# Patient Record
Sex: Male | Born: 1955
Health system: Southern US, Community
[De-identification: ages and names within clinical notes are randomized; demographics above are authoritative.]

## PROBLEM LIST (undated history)

## (undated) DIAGNOSIS — H269 Unspecified cataract: Secondary | ICD-10-CM

## (undated) DIAGNOSIS — H409 Unspecified glaucoma: Secondary | ICD-10-CM

## (undated) DIAGNOSIS — R011 Cardiac murmur, unspecified: Secondary | ICD-10-CM

## (undated) DIAGNOSIS — J45909 Unspecified asthma, uncomplicated: Secondary | ICD-10-CM

## (undated) DIAGNOSIS — I10 Essential (primary) hypertension: Secondary | ICD-10-CM

## (undated) DIAGNOSIS — T7840XA Allergy, unspecified, initial encounter: Secondary | ICD-10-CM

## (undated) DIAGNOSIS — M1990 Unspecified osteoarthritis, unspecified site: Secondary | ICD-10-CM

## (undated) DIAGNOSIS — E785 Hyperlipidemia, unspecified: Secondary | ICD-10-CM

## (undated) DIAGNOSIS — H3320 Serous retinal detachment, unspecified eye: Secondary | ICD-10-CM

## (undated) HISTORY — DX: Unspecified asthma, uncomplicated: J45.909

## (undated) HISTORY — DX: Unspecified glaucoma: H40.9

## (undated) HISTORY — PX: FINGER SURGERY: SHX640

## (undated) HISTORY — DX: Cardiac murmur, unspecified: R01.1

## (undated) HISTORY — DX: Unspecified cataract: H26.9

## (undated) HISTORY — PX: COLONOSCOPY: SHX174

## (undated) HISTORY — DX: Allergy, unspecified, initial encounter: T78.40XA

## (undated) HISTORY — DX: Essential (primary) hypertension: I10

## (undated) HISTORY — DX: Unspecified osteoarthritis, unspecified site: M19.90

## (undated) HISTORY — PX: FOOT SURGERY: SHX648

## (undated) HISTORY — PX: EYE SURGERY: SHX253

## (undated) HISTORY — DX: Serous retinal detachment, unspecified eye: H33.20

## (undated) HISTORY — DX: Hyperlipidemia, unspecified: E78.5

---

## 2005-02-06 ENCOUNTER — Ambulatory Visit: Payer: Self-pay | Admitting: Endocrinology

## 2005-02-17 ENCOUNTER — Ambulatory Visit: Payer: Self-pay | Admitting: Internal Medicine

## 2005-11-13 ENCOUNTER — Ambulatory Visit: Payer: Self-pay | Admitting: Internal Medicine

## 2005-11-16 ENCOUNTER — Emergency Department (HOSPITAL_COMMUNITY): Admission: EM | Admit: 2005-11-16 | Discharge: 2005-11-16 | Payer: Self-pay | Admitting: Emergency Medicine

## 2006-02-02 ENCOUNTER — Ambulatory Visit: Payer: Self-pay | Admitting: Internal Medicine

## 2006-04-06 ENCOUNTER — Encounter (INDEPENDENT_AMBULATORY_CARE_PROVIDER_SITE_OTHER): Payer: Self-pay | Admitting: *Deleted

## 2006-04-06 ENCOUNTER — Ambulatory Visit (HOSPITAL_BASED_OUTPATIENT_CLINIC_OR_DEPARTMENT_OTHER): Admission: RE | Admit: 2006-04-06 | Discharge: 2006-04-06 | Payer: Self-pay | Admitting: Orthopedic Surgery

## 2006-04-13 ENCOUNTER — Ambulatory Visit: Payer: Self-pay | Admitting: Internal Medicine

## 2006-04-27 ENCOUNTER — Emergency Department (HOSPITAL_COMMUNITY): Admission: EM | Admit: 2006-04-27 | Discharge: 2006-04-28 | Payer: Self-pay | Admitting: Emergency Medicine

## 2006-04-29 ENCOUNTER — Ambulatory Visit: Payer: Self-pay | Admitting: Cardiology

## 2006-04-30 ENCOUNTER — Ambulatory Visit: Payer: Self-pay | Admitting: Internal Medicine

## 2006-12-28 ENCOUNTER — Ambulatory Visit: Payer: Self-pay | Admitting: Internal Medicine

## 2006-12-28 LAB — CONVERTED CEMR LAB
ALT: 20 units/L (ref 0–40)
AST: 25 units/L (ref 0–37)
Albumin: 3.8 g/dL (ref 3.5–5.2)
Alkaline Phosphatase: 56 units/L (ref 39–117)
BUN: 17 mg/dL (ref 6–23)
Basophils Absolute: 0 10*3/uL (ref 0.0–0.1)
Basophils Relative: 0.6 % (ref 0.0–1.0)
Bilirubin Urine: NEGATIVE
Bilirubin, Direct: 0.1 mg/dL (ref 0.0–0.3)
CO2: 31 meq/L (ref 19–32)
Calcium: 9.5 mg/dL (ref 8.4–10.5)
Chloride: 102 meq/L (ref 96–112)
Cholesterol: 196 mg/dL (ref 0–200)
Creatinine, Ser: 1.2 mg/dL (ref 0.4–1.5)
Crystals: NEGATIVE
Eosinophils Absolute: 0.3 10*3/uL (ref 0.0–0.6)
Eosinophils Relative: 6.7 % — ABNORMAL HIGH (ref 0.0–5.0)
GFR calc Af Amer: 82 mL/min
GFR calc non Af Amer: 68 mL/min
Glucose, Bld: 135 mg/dL — ABNORMAL HIGH (ref 70–99)
HCT: 44.9 % (ref 39.0–52.0)
HDL: 41.2 mg/dL (ref >39.0)
Hemoglobin, Urine: NEGATIVE
Hemoglobin: 15.3 g/dL (ref 13.0–17.0)
Ketones, ur: NEGATIVE mg/dL
LDL Cholesterol: 140 mg/dL — ABNORMAL HIGH (ref 0–99)
Leukocytes, UA: NEGATIVE
Lymphocytes Relative: 46.2 % — ABNORMAL HIGH (ref 12.0–46.0)
MCHC: 34 g/dL (ref 30.0–36.0)
MCV: 85.2 fL (ref 78.0–100.0)
Monocytes Absolute: 0.4 10*3/uL (ref 0.2–0.7)
Monocytes Relative: 9.7 % (ref 3.0–11.0)
Mucus, UA: NEGATIVE
Neutro Abs: 1.5 10*3/uL (ref 1.4–7.7)
Neutrophils Relative %: 36.8 % — ABNORMAL LOW (ref 43.0–77.0)
Nitrite: NEGATIVE
PSA: 2.39 ng/mL (ref 0.10–4.00)
Platelets: 231 10*3/uL (ref 150–400)
Potassium: 3.3 meq/L — ABNORMAL LOW (ref 3.5–5.1)
RBC / HPF: NONE SEEN
RBC: 5.27 M/uL (ref 4.22–5.81)
RDW: 12.9 % (ref 11.5–14.6)
Sodium: 139 meq/L (ref 135–145)
Specific Gravity, Urine: 1.02 (ref 1.000–1.03)
TSH: 0.52 microintl units/mL (ref 0.35–5.50)
Total Bilirubin: 1 mg/dL (ref 0.3–1.2)
Total CHOL/HDL Ratio: 4.8
Total Protein, Urine: >=300 mg/dL — AB
Total Protein: 7.3 g/dL (ref 6.0–8.3)
Triglycerides: 72 mg/dL (ref 0–149)
Urine Glucose: NEGATIVE mg/dL
Urobilinogen, UA: 1 (ref 0.0–1.0)
VLDL: 14 mg/dL (ref 0–40)
WBC: 4.2 10*3/uL — ABNORMAL LOW (ref 4.5–10.5)
pH: 7 (ref 5.0–8.0)

## 2007-01-05 ENCOUNTER — Ambulatory Visit: Payer: Self-pay | Admitting: Internal Medicine

## 2007-05-27 ENCOUNTER — Encounter: Payer: Self-pay | Admitting: Internal Medicine

## 2007-09-14 ENCOUNTER — Encounter: Payer: Self-pay | Admitting: Internal Medicine

## 2008-02-29 ENCOUNTER — Ambulatory Visit: Payer: Self-pay | Admitting: Internal Medicine

## 2008-02-29 DIAGNOSIS — I10 Essential (primary) hypertension: Secondary | ICD-10-CM | POA: Insufficient documentation

## 2008-02-29 DIAGNOSIS — J209 Acute bronchitis, unspecified: Secondary | ICD-10-CM | POA: Insufficient documentation

## 2008-02-29 DIAGNOSIS — E119 Type 2 diabetes mellitus without complications: Secondary | ICD-10-CM

## 2008-02-29 DIAGNOSIS — E785 Hyperlipidemia, unspecified: Secondary | ICD-10-CM | POA: Insufficient documentation

## 2008-03-05 ENCOUNTER — Telehealth: Payer: Self-pay | Admitting: Internal Medicine

## 2008-03-07 ENCOUNTER — Telehealth: Payer: Self-pay | Admitting: Internal Medicine

## 2008-08-09 ENCOUNTER — Telehealth (INDEPENDENT_AMBULATORY_CARE_PROVIDER_SITE_OTHER): Payer: Self-pay | Admitting: *Deleted

## 2008-10-22 ENCOUNTER — Ambulatory Visit: Payer: Self-pay | Admitting: Internal Medicine

## 2008-10-22 DIAGNOSIS — G4700 Insomnia, unspecified: Secondary | ICD-10-CM | POA: Insufficient documentation

## 2008-10-22 DIAGNOSIS — N401 Enlarged prostate with lower urinary tract symptoms: Secondary | ICD-10-CM | POA: Insufficient documentation

## 2008-10-22 DIAGNOSIS — R5381 Other malaise: Secondary | ICD-10-CM | POA: Insufficient documentation

## 2008-10-22 DIAGNOSIS — R5383 Other fatigue: Secondary | ICD-10-CM

## 2008-10-22 DIAGNOSIS — N400 Enlarged prostate without lower urinary tract symptoms: Secondary | ICD-10-CM | POA: Insufficient documentation

## 2008-10-22 LAB — CONVERTED CEMR LAB
Alkaline Phosphatase: 67 units/L (ref 39–117)
Basophils Absolute: 0.1 10*3/uL (ref 0.0–0.1)
Bilirubin Urine: NEGATIVE
Bilirubin, Direct: 0.1 mg/dL (ref 0.0–0.3)
Blood Glucose, Fingerstick: 206
Calcium: 9.2 mg/dL (ref 8.4–10.5)
Chloride: 101 meq/L (ref 96–112)
Creatinine, Ser: 1.1 mg/dL (ref 0.4–1.5)
Creatinine,U: 187.7 mg/dL
Eosinophils Absolute: 0.3 10*3/uL (ref 0.0–0.7)
HDL: 47.4 mg/dL (ref >39.00)
Ketones, ur: NEGATIVE mg/dL
LDL Cholesterol: 125 mg/dL — ABNORMAL HIGH (ref 0–99)
Leukocytes, UA: NEGATIVE
Lymphocytes Relative: 38.2 % (ref 12.0–46.0)
MCHC: 34 g/dL (ref 30.0–36.0)
Microalb Creat Ratio: 1.1 mg/g (ref 0.0–30.0)
Microalb, Ur: 0.2 mg/dL (ref 0.0–1.9)
Neutro Abs: 2 10*3/uL (ref 1.4–7.7)
Neutrophils Relative %: 44.9 % (ref 43.0–77.0)
Platelets: 234 10*3/uL (ref 150.0–400.0)
RDW: 12.7 % (ref 11.5–14.6)
Sodium: 139 meq/L (ref 135–145)
Total Bilirubin: 1 mg/dL (ref 0.3–1.2)
Total CHOL/HDL Ratio: 4
Triglycerides: 63 mg/dL (ref 0.0–149.0)
pH: 7.5 (ref 5.0–8.0)

## 2009-05-24 ENCOUNTER — Telehealth: Payer: Self-pay | Admitting: Internal Medicine

## 2009-10-04 ENCOUNTER — Telehealth: Payer: Self-pay | Admitting: Internal Medicine

## 2009-12-05 ENCOUNTER — Ambulatory Visit: Payer: Self-pay | Admitting: Internal Medicine

## 2009-12-05 DIAGNOSIS — R209 Unspecified disturbances of skin sensation: Secondary | ICD-10-CM | POA: Insufficient documentation

## 2009-12-06 ENCOUNTER — Telehealth: Payer: Self-pay | Admitting: Internal Medicine

## 2009-12-17 LAB — CONVERTED CEMR LAB
Alkaline Phosphatase: 65 units/L (ref 39–117)
Basophils Absolute: 0.1 10*3/uL (ref 0.0–0.1)
Bilirubin, Direct: 0.2 mg/dL (ref 0.0–0.3)
CO2: 32 meq/L (ref 19–32)
Calcium: 9.6 mg/dL (ref 8.4–10.5)
Eosinophils Absolute: 0.5 10*3/uL (ref 0.0–0.7)
GFR calc non Af Amer: 73.63 mL/min (ref >60)
HCT: 45.4 % (ref 39.0–52.0)
Hemoglobin: 15.8 g/dL (ref 13.0–17.0)
Leukocytes, UA: NEGATIVE
Lymphs Abs: 2 10*3/uL (ref 0.7–4.0)
MCHC: 34.7 g/dL (ref 30.0–36.0)
MCV: 84.8 fL (ref 78.0–100.0)
Monocytes Absolute: 0.5 10*3/uL (ref 0.1–1.0)
Neutro Abs: 3.2 10*3/uL (ref 1.4–7.7)
Nitrite: NEGATIVE
RDW: 13.9 % (ref 11.5–14.6)
Sodium: 135 meq/L (ref 135–145)
Total Protein, Urine: 100 mg/dL
Total Protein: 7 g/dL (ref 6.0–8.3)
Vitamin B-12: 758 pg/mL (ref 211–911)
pH: 7 (ref 5.0–8.0)

## 2010-06-11 ENCOUNTER — Telehealth: Payer: Self-pay | Admitting: Internal Medicine

## 2010-08-19 NOTE — Progress Notes (Signed)
Summary: RF zolpidem  Phone Note Refill Request   Refills Requested: Medication #1:  ZOLPIDEM TARTRATE 10 MG TABS 1/2-1 tab at bedtime as needed insomnia   Dosage confirmed as above?Dosage Confirmed   Supply Requested: 30   Last Refilled: 01/06/2010 ******[facility]   Method Requested: Telephone to Pharmacy Next Appointment Scheduled: none Initial call taken by: Lanier Prude, [facility]),  June 11, 2010 8:20 AM  Follow-up for Phone Call        ok 3 ref Follow-up by: Tresa Garter MD,  June 11, 2010 1:01 PM    Prescriptions: ZOLPIDEM TARTRATE 10 MG TABS (ZOLPIDEM TARTRATE) 1/2-1 tab at bedtime as needed insomnia  #30 x 3   Entered by:   Lamar Sprinkles, CMA   Authorized by:   Tresa Garter MD   Signed by:   Lamar Sprinkles, CMA on 06/11/2010   Method used:   Telephoned to ...       [facility]* (retail)       [address]       [place], [place]  [address]       Ph: [phone]       Fax: [phone]   RxID:   [id number]

## 2010-08-19 NOTE — Progress Notes (Signed)
Summary: [facility]  Phone Note Other Incoming Call back at [facility]   Caller: [facility] Summary of Call: REFILL ON DOXAZOSIN  MESYLATE 4MG ---OK X 2 REFILLS/VG Initial call taken by: Tora Perches,  October 04, 2009 4:56 PM    Prescriptions: DOXAZOSIN MESYLATE 4 MG TABS (DOXAZOSIN MESYLATE) 1or 1/2  by mouth at bedtime  #30 x 2   Entered by:   Tora Perches   Authorized by:   Tresa Garter MD   Signed by:   Tora Perches on 10/04/2009   Method used:   Electronically to        [facility]* (retail)       [address]       [place], [place]  [address]       Ph: [phone]       Fax: [phone]   RxID:   [id number]

## 2010-08-19 NOTE — Progress Notes (Signed)
       New/Updated Medications: * GLUCOMETER TEST STRIPS use once daily as needed *Brand of pt's choice Prescriptions: GLUCOMETER TEST STRIPS use once daily as needed *Brand of pt's choice  #3 mth x 3    Entered by:   Lamar Sprinkles, CMA   Authorized by:   Jacques Navy MD   Signed by:   Lamar Sprinkles, CMA on 12/06/2009   Method used:   Faxed to ...       [facility]* (retail)       [address]       [place], [place]  [address]       Ph: [phone]       Fax: [phone]   RxID:   [id number]

## 2010-08-19 NOTE — Assessment & Plan Note (Signed)
Summary: ov per pt/?/cd   Vital Signs:  Patient profile:   55 year old male Height:      68 inches Weight:      192 pounds BMI:     29.30 O2 Sat:      98 % on Room air Temp:     98.1 degrees F oral Pulse rate:   79 / minute BP sitting:   150 / 100  (left arm) Cuff size:   large  Vitals Entered By: Lucious Groves (Dec 05, 2009 9:45 AM)  O2 Flow:  Room air CC: C/O sking tingling, sinus/allergy issues, HA, and notes increased BP lately./kb Is Patient Diabetic? Yes Pain Assessment Patient in pain? no      CBG Result 207 CBG Device ID Freestyle Lite   CC:  C/O sking tingling, sinus/allergy issues, HA, and and notes increased BP lately./kb.  History of Present Illness: The patient presents for a follow up of hypertension, diabetes, hyperlipidemia Ran out of meds   Current Medications (verified): 1)  Amlodipine Besylate 5 Mg Tabs (Amlodipine Besylate) .... Take 1 Tablet By Mouth Once A Day 2)  Doxazosin Mesylate 4 Mg Tabs (Doxazosin Mesylate) .[name].. 1or 1/2  By Mouth At Bedtime 3)  Hydrochlorothiazide 25 Mg Tabs (Hydrochlorothiazide) .[name].. 1 Tablet By Mouth Once A Day 4)  Metformin Hcl 500 Mg  Tabs (Metformin Hcl) .... 2 Tabs  Two Times A Day 5)  Vitamin D3 1000 Unit  Tabs (Cholecalciferol) .[name].. 1 By Mouth Daily 6)  Aspirin 81 Mg  Tbec (Aspirin) .... One By Mouth Every Day 7)  Malarone 250-100 Mg  Tabs (Atovaquone-Proguanil Hcl) .[name].. 1 By Mouth [place] and Then X 1 Wk When Back 8)  Zolpidem Tartrate 10 Mg  Tabs (Zolpidem Tartrate) .... 1/2 or 1 By Mouth At [facility] Prn 9)  Tamiflu 75 Mg  Caps (Oseltamivir Phosphate) .[name].. 1 By Mouth Bid 10)  Lipitor 20 Mg Tabs (Atorvastatin Calcium) .... Take 1 Tab By Mouth Daily 11)  Freestyle Lite Test  Strp (Glucose Blood) .... Once Daily Prn 12)  Freestyle Unistick Ii Lancets  Misc (Lancets) .[name].. 1 Once Daily Prn  Allergies (verified): No Known Drug Allergies  Past History:  Social History: Last updated:  02/29/2008 Occupation: Product/process development scientist Married Never Smoked  Past Medical History: Diabetes mellitus, type II Hyperlipidemia Hypertension Microalbuminuria 2011  Past Surgical History: Denies surgical history  Review of Systems  The patient denies anorexia, fever, weight loss, weight gain, vision loss, decreased hearing, hoarseness, chest pain, syncope, dyspnea on exertion, peripheral edema, prolonged cough, headaches, hemoptysis, abdominal pain, melena, hematochezia, severe indigestion/heartburn, hematuria, incontinence, genital sores, muscle weakness, suspicious skin lesions, transient blindness, difficulty walking, depression, unusual weight change, abnormal bleeding, enlarged lymph nodes, angioedema, and testicular masses.         skin tingling at times  Physical Exam  General:  Well-developed,well-nourished,in no acute distress; alert,appropriate and cooperative throughout examination Head:  Normocephalic and atraumatic without obvious abnormalities. No apparent alopecia or balding. Eyes:  No corneal or conjunctival inflammation noted. EOMI. Perrla. Funduscopic exam benign, without hemorrhages, exudates or papilledema. Vision grossly normal. Ears:  External ear exam shows no significant lesions or deformities.  Otoscopic examination reveals clear canals, tympanic membranes are intact bilaterally without bulging, retraction, inflammation or discharge. Hearing is grossly normal bilaterally. Nose:  External nasal examination shows no deformity or inflammation. Nasal mucosa are pink and moist without lesions or exudates. Mouth:  Eryth throat Neck:  No deformities, masses,  or tenderness noted. Lungs:  Normal respiratory effort, chest expands symmetrically. Lungs are clear to auscultation, no crackles or wheezes. Heart:  Normal rate and regular rhythm. S1 and S2 normal without gallop, murmur, click, rub or other extra sounds. Abdomen:  Bowel sounds positive,abdomen soft and non-tender  without masses, organomegaly or hernias noted. Msk:  No deformity or scoliosis noted of thoracic or lumbar spine.   Pulses:  R and L carotid,radial,femoral,dorsalis pedis and posterior tibial pulses are full and equal bilaterally Extremities:  No clubbing, cyanosis, edema, or deformity noted with normal full range of motion of all joints.   Neurologic:  No cranial nerve deficits noted. Station and gait are normal. Plantar reflexes are down-going bilaterally. DTRs are symmetrical throughout. Sensory, motor and coordinative functions appear intact. Skin:  Intact without suspicious lesions or rashes Cervical Nodes:  No lymphadenopathy noted Inguinal Nodes:  No significant adenopathy Psych:  Cognition and judgment appear intact. Alert and cooperative with normal attention span and concentration. No apparent delusions, illusions, hallucinations  Diabetes Management Exam:    Foot Exam (with socks and/or shoes not present):       Sensory-Pinprick/Light touch:          Left medial foot (L-4): normal          Left dorsal foot (L-5): normal          Left lateral foot (S-1): normal       Sensory-Monofilament:          Left foot: normal       Inspection:          Left foot: normal       Nails:          Left foot: normal   Impression & Recommendations:  Problem # 1:  FATIGUE (ICD-780.79) Assessment New Restart Meds Orders: TLB-B12, Serum-Total ONLY (04540-J81) TLB-BMP (Basic Metabolic Panel-BMET) (80048-METABOL) TLB-Hepatic/Liver Function Pnl (80076-HEPATIC) TLB-CBC Platelet - w/Differential (85025-CBCD) TLB-TSH (Thyroid Stimulating Hormone) (84443-TSH) TLB-Udip ONLY (81003-UDIP) TLB-B12 + Folate Pnl (19147_82956-O13/YQM) TLB-A1C / Hgb A1C (Glycohemoglobin) (83036-A1C) TLB-Microalbumin/Creat Ratio, Urine (82043-MALB)  Problem # 2:  DIABETES MELLITUS, TYPE II (ICD-250.00) Assessment: Unchanged Risks of noncompliance with visits/Rx discussed. Compliance encouraged.  His updated medication  list for this problem includes:    Tribenzor 40-5-12.5 Mg Tabs (Olmesartan-amlodipine-hctz) .[name]... 1 by mouth qd    Metformin Hcl 500 Mg Tabs (Metformin hcl) .[name]... 2 tabs  two times a day    Aspirin 81 Mg Tbec (Aspirin) ..... One by mouth every day  Orders: TLB-B12, Serum-Total ONLY (57846-N62) TLB-BMP (Basic Metabolic Panel-BMET) (80048-METABOL) TLB-Hepatic/Liver Function Pnl (80076-HEPATIC) TLB-CBC Platelet - w/Differential (85025-CBCD) TLB-TSH (Thyroid Stimulating Hormone) (84443-TSH) TLB-Udip ONLY (81003-UDIP) TLB-B12 + Folate Pnl (95284_13244-W10/UVO) TLB-A1C / Hgb A1C (Glycohemoglobin) (83036-A1C) TLB-Microalbumin/Creat Ratio, Urine (82043-MALB)  Problem # 3:  HYPERTENSION (ICD-401.9) Assessment: Unchanged  The following medications were removed from the medication list:    Amlodipine Besylate 5 Mg Tabs (Amlodipine besylate) .[name]... Take 1 tablet by mouth once a day    Hydrochlorothiazide 25 Mg Tabs (Hydrochlorothiazide) .[name]... 1 tablet by mouth once a day His updated medication list for this problem includes:    Tribenzor 40-5-12.5 Mg Tabs (Olmesartan-amlodipine-hctz) .[name]... 1 by mouth qd    Doxazosin Mesylate 4 Mg Tabs (Doxazosin mesylate) .[name]... 1or 1/2  by mouth at bedtime  Orders: TLB-B12, Serum-Total ONLY (53664-Q03) TLB-BMP (Basic Metabolic Panel-BMET) (80048-METABOL) TLB-Hepatic/Liver Function Pnl (80076-HEPATIC) TLB-CBC Platelet - w/Differential (85025-CBCD) TLB-TSH (Thyroid Stimulating Hormone) (84443-TSH) TLB-Udip ONLY (81003-UDIP) TLB-B12 + Folate Pnl (47425_95638-V56/EPP) TLB-A1C /  Hgb A1C (Glycohemoglobin) (83036-A1C) TLB-Microalbumin/Creat Ratio, Urine (82043-MALB)  BP today: 150/100 Prior BP: 156/104 (10/22/2008)  Labs Reviewed: K+: 3.5 (10/22/2008) Creat: : 1.1 (10/22/2008)   Chol: 185 (10/22/2008)   HDL: 47.40 (10/22/2008)   LDL: 125 (10/22/2008)   TG: 63.0 (10/22/2008)  Problem # 4:  INSOMNIA, PERSISTENT (ICD-307.42) Assessment:  Unchanged Ambien  Problem # 5:  PARESTHESIA (ICD-782.0) Assessment: New Poss due to elev. CBGs Orders: TLB-B12, Serum-Total ONLY (27253-G64) TLB-BMP (Basic Metabolic Panel-BMET) (80048-METABOL) TLB-Hepatic/Liver Function Pnl (80076-HEPATIC) TLB-CBC Platelet - w/Differential (85025-CBCD) TLB-TSH (Thyroid Stimulating Hormone) (84443-TSH) TLB-Udip ONLY (81003-UDIP) TLB-B12 + Folate Pnl (40347_42595-G38/VFI) TLB-A1C / Hgb A1C (Glycohemoglobin) (83036-A1C) TLB-Microalbumin/Creat Ratio, Urine (82043-MALB) T-Vitamin D (25-Hydroxy) (43329-51884)  Complete Medication List: 1)  Tribenzor 40-5-12.5 Mg Tabs (Olmesartan-amlodipine-hctz) .[name].. 1 by mouth qd 2)  Doxazosin Mesylate 4 Mg Tabs (Doxazosin mesylate) .[name].. 1or 1/2  by mouth at bedtime 3)  Metformin Hcl 500 Mg Tabs (Metformin hcl) .... 2 tabs  two times a day 4)  Vitamin D3 1000 Unit Tabs (Cholecalciferol) .[name].. 1 by mouth daily 5)  Aspirin 81 Mg Tbec (Aspirin) .... One by mouth every day 6)  Malarone 250-100 Mg Tabs (Atovaquone-proguanil hcl) .[name].. 1 by mouth [place] and then x 1 wk when back 7)  Tamiflu 75 Mg Caps (Oseltamivir phosphate) .[name].. 1 by mouth bid 8)  Lipitor 20 Mg Tabs (Atorvastatin calcium) .... Take 1 tab by mouth daily 9)  Accu-chek Instant Glucose Test Strp (Glucose blood) .... Use once daily prn 10)  Accu-chek Softclix Lancets Misc (Lancets) .... Use once daily prn 11)  Zolpidem Tartrate 10 Mg Tabs (Zolpidem tartrate) .... 1/2-1 tab at bedtime as needed insomnia 12)  Ciprofloxacin Hcl 500 Mg Tabs (Ciprofloxacin hcl) .[name].. 1 by mouth bid 13)  Sudafed 12 Hour 120 Mg Xr12h-tab (Pseudoephedrine hcl) .[name].. 1 by mouth two times a day as needed allergies  Patient Instructions: 1)  Please schedule a follow-up appointment in 3 months. 2)  BMP prior to visit, ICD-9: 3)  Hepatic Panel prior to visit, ICD-9: 4)  TSH prior to visit, ICD-9: 5)  HbgA1C prior to visit, ICD-9:250.00 Prescriptions: SUDAFED 12 HOUR  120 MG XR12H-TAB (PSEUDOEPHEDRINE HCL) 1 by mouth two times a day as needed allergies  #60 x 1   Entered and Authorized by:   Tresa Garter MD   Signed by:   Tresa Garter MD on 12/05/2009   Method used:   Print then Give to Patient   RxID:   [id number] CIPROFLOXACIN HCL 500 MG TABS (CIPROFLOXACIN HCL) 1 by mouth bid  #20 x 1   Entered and Authorized by:   Tresa Garter MD   Signed by:   Tresa Garter MD on 12/05/2009   Method used:   Print then Give to Patient   RxID:   [id number] ZOLPIDEM TARTRATE 10 MG TABS (ZOLPIDEM TARTRATE) 1/2-1 tab at bedtime as needed insomnia  #30 x 6   Entered and Authorized by:   Tresa Garter MD   Signed by:   Tresa Garter MD on 12/05/2009   Method used:   Print then Give to Patient   RxID:   [id number] ACCU-CHEK SOFTCLIX LANCETS  MISC (LANCETS) use once daily prn  #50 x 12   Entered and Authorized by:   Tresa Garter MD   Signed by:   Tresa Garter MD on 12/05/2009   Method used:   Print then Give to Patient   RxID:  [id number] ACCU-CHEK INSTANT GLUCOSE TEST  STRP (GLUCOSE BLOOD) use once daily prn  #50 x 11   Entered and Authorized by:   Tresa Garter MD   Signed by:   Tresa Garter MD on 12/05/2009   Method used:   Print then Give to Patient   RxID:   [id number] LIPITOR 20 MG TABS (ATORVASTATIN CALCIUM) Take 1 tab by mouth daily  #90 x 3   Entered and Authorized by:   Tresa Garter MD   Signed by:   Tresa Garter MD on 12/05/2009   Method used:   Print then Give to Patient   RxID:   [id number] ZOLPIDEM TARTRATE 10 MG  TABS (ZOLPIDEM TARTRATE) 1/2 or 1 by mouth at hs prn  #30 x 5   Entered and Authorized by:   Tresa Garter MD   Signed by:   Tresa Garter MD on 12/05/2009   Method used:   Print then Give to Patient   RxID:   [id number] MALARONE 250-100 MG  TABS (ATOVAQUONE-PROGUANIL HCL) 1 [place] and then x 1 wk when [place]  #30 Tablet x 2   Entered and Authorized by:   Tresa Garter MD   Signed by:   Tresa Garter MD on 12/05/2009   Method used:   Print then Give to Patient   RxID:   [id number] METFORMIN HCL 500 MG  TABS (METFORMIN HCL) 2 tabs  two times a day  #120 x 12   Entered and Authorized by:   Tresa Garter MD   Signed by:   Tresa Garter MD on 12/05/2009   Method used:   Print then Give to Patient   RxID:   [id number] DOXAZOSIN MESYLATE 4 MG TABS (DOXAZOSIN MESYLATE) 1or 1/2  by mouth at bedtime  #30 x 2   Entered and Authorized by:   Tresa Garter MD   Signed by:   Tresa Garter MD on 12/05/2009   Method used:   Print then Give to Patient   RxID:   [id number] 40-5-12.5 MG TABS (OLMESARTAN-AMLODIPINE-HCTZ) 1 by mouth qd  #90 x 3   Entered and Authorized by:   Tresa Garter MD   Signed by:   Tresa Garter MD on 12/05/2009   Method used:   Print then Give to Patient   RxID:   [id number]

## 2010-08-25 ENCOUNTER — Encounter: Payer: Self-pay | Admitting: Internal Medicine

## 2010-08-25 ENCOUNTER — Ambulatory Visit (INDEPENDENT_AMBULATORY_CARE_PROVIDER_SITE_OTHER): Payer: BC Managed Care – PPO | Admitting: Internal Medicine

## 2010-08-25 ENCOUNTER — Other Ambulatory Visit: Payer: Self-pay | Admitting: Internal Medicine

## 2010-08-25 ENCOUNTER — Ambulatory Visit (INDEPENDENT_AMBULATORY_CARE_PROVIDER_SITE_OTHER)
Admission: RE | Admit: 2010-08-25 | Discharge: 2010-08-25 | Disposition: A | Discharge status: Home / Self Care | Payer: BC Managed Care – PPO | Source: Ambulatory Visit | Attending: Internal Medicine | Admitting: Internal Medicine

## 2010-08-25 ENCOUNTER — Other Ambulatory Visit: Payer: BC Managed Care – PPO

## 2010-08-25 DIAGNOSIS — R079 Chest pain, unspecified: Secondary | ICD-10-CM

## 2010-08-25 DIAGNOSIS — Z136 Encounter for screening for cardiovascular disorders: Secondary | ICD-10-CM

## 2010-08-25 DIAGNOSIS — I2699 Other pulmonary embolism without acute cor pulmonale: Secondary | ICD-10-CM

## 2010-08-25 DIAGNOSIS — J209 Acute bronchitis, unspecified: Secondary | ICD-10-CM

## 2010-08-25 DIAGNOSIS — E119 Type 2 diabetes mellitus without complications: Secondary | ICD-10-CM

## 2010-08-25 DIAGNOSIS — E785 Hyperlipidemia, unspecified: Secondary | ICD-10-CM

## 2010-08-25 DIAGNOSIS — I10 Essential (primary) hypertension: Secondary | ICD-10-CM

## 2010-08-25 LAB — CBC WITH DIFFERENTIAL/PLATELET
Basophils Absolute: 0 10*3/uL (ref 0.0–0.1)
Eosinophils Absolute: 0.6 10*3/uL (ref 0.0–0.7)
HCT: 42.5 % (ref 39.0–52.0)
Lymphs Abs: 1.6 10*3/uL (ref 0.7–4.0)
MCV: 86.4 fl (ref 78.0–100.0)
Monocytes Absolute: 0.4 10*3/uL (ref 0.1–1.0)
Platelets: 203 10*3/uL (ref 150.0–400.0)
RDW: 13.7 % (ref 11.5–14.6)

## 2010-08-25 LAB — BASIC METABOLIC PANEL
BUN: 19 mg/dL (ref 6–23)
Chloride: 98 mEq/L (ref 96–112)
Glucose, Bld: 99 mg/dL (ref 70–99)
Potassium: 4.6 mEq/L (ref 3.5–5.1)

## 2010-08-25 LAB — HEPATIC FUNCTION PANEL: Total Bilirubin: 0.7 mg/dL (ref 0.3–1.2)

## 2010-08-25 LAB — CONVERTED CEMR LAB: Blood Glucose, Fingerstick: 152

## 2010-08-25 LAB — HEMOGLOBIN A1C: Hgb A1c MFr Bld: 6.7 % — ABNORMAL HIGH (ref 4.6–6.5)

## 2010-08-25 LAB — TSH: TSH: 0.72 u[IU]/mL (ref 0.35–5.50)

## 2010-08-25 MED ORDER — IOHEXOL 300 MG/ML  SOLN
75.0000 mL | Freq: Once | INTRAMUSCULAR | Status: AC | PRN
  Administered 2010-08-25: 75 mL via INTRAVENOUS

## 2010-09-04 NOTE — Assessment & Plan Note (Signed)
Summary: COUGH/ CHEST CONGESTION / MAY GO TO UC /[facility]   Vital Signs:  Patient profile:   55 year old male Height:      68 inches Weight:      194 pounds BMI:     29.60 O2 Sat:      96 % on Room air Temp:     98.4 degrees F oral Pulse rate:   78 / minute Pulse rhythm:   regular BP sitting:   150 / 98  (left arm) Cuff size:   regular  Vitals Entered By: Lanier Prude, CMA(AAMA) (August 25, 2010 9:03 AM)  O2 Flow:  Room air CC: cough, sinus congestion X 2 wks Is Patient Diabetic? Yes CBG Result 152   CC:  cough and sinus congestion X 2 wks.  History of Present Illness: The patient presents for a follow up of hypertension, diabetes, hyperlipidemia  C/o CP started 1-2 wks ago in the middle; moderate in intensity, worse with coughing. Coughing up yellow mucus. He is having some CP now. Came back from [place] Jan 22nd   Current Medications (verified): 1)  Tribenzor 40-5-12.5 Mg Tabs (Olmesartan-Amlodipine-Hctz) .[name].. 1 By Mouth Qd 2)  Doxazosin Mesylate 4 Mg Tabs (Doxazosin Mesylate) .[name].. 1or 1/2  By Mouth At Bedtime 3)  Metformin Hcl 500 Mg  Tabs (Metformin Hcl) .... 2 Tabs  Two Times A Day 4)  Vitamin D3 1000 Unit  Tabs (Cholecalciferol) .[name].. 1 By Mouth Daily 5)  Aspirin 81 Mg  Tbec (Aspirin) .... One By Mouth Every Day 6)  Malarone 250-100 Mg  Tabs (Atovaquone-Proguanil Hcl) .[name].. 1 By Mouth Once [place] and Then X 1 Wk When Back 7)  Tamiflu 75 Mg  Caps (Oseltamivir Phosphate) .[name].. 1 By Mouth Bid 8)  Lipitor 20 Mg Tabs (Atorvastatin Calcium) .... Take 1 Tab By Mouth Daily 9)  Glucometer Test Strips .... Use Once Daily As Needed *brand of Pt's Choice 10)  Accu-Chek Softclix Lancets  Misc (Lancets) .... Use Once Daily Prn 11)  Zolpidem Tartrate 10 Mg Tabs (Zolpidem Tartrate) .... 1/2-1 Tab At Bedtime As Needed Insomnia 12)  Sudafed 12 Hour 120 Mg Xr12h-Tab (Pseudoephedrine Hcl) .[name].. 1 By Mouth Two Times A Day As Needed Allergies  Allergies (verified): No  Known Drug Allergies  Past History:  Past Medical History: Last updated: 12/05/2009 Diabetes mellitus, type II Hyperlipidemia Hypertension Microalbuminuria 2011  Social History: Last updated: 02/29/2008 Occupation: Product/process development scientist Married Never Smoked  Review of Systems       The patient complains of chest pain, dyspnea on exertion, and prolonged cough.  The patient denies fever, abdominal pain, and melena.    Physical Exam  General:  Well-developed,well-nourished,in no acute distress; alert,appropriate and cooperative throughout examination Ears:  External ear exam shows no significant lesions or deformities.  Otoscopic examination reveals clear canals, tympanic membranes are intact bilaterally without bulging, retraction, inflammation or discharge. Hearing is grossly normal bilaterally. Nose:  External nasal examination shows no deformity or inflammation. Nasal mucosa are pink and moist without lesions or exudates. Mouth:  Eryth throat Neck:  No deformities, masses, or tenderness noted. Lungs:  Normal respiratory effort, chest expands symmetrically. Lungs are clear to auscultation, no crackles or wheezes. Heart:  Normal rate and regular rhythm. S1 and S2 normal without gallop, murmur, click, rub or other extra sounds. Abdomen:  Bowel sounds positive,abdomen soft and non-tender without masses, organomegaly or hernias noted. Msk:  No deformity or scoliosis noted of thoracic or lumbar spine.  Pulses:  R and L carotid,radial,femoral,dorsalis pedis and posterior tibial pulses are full and equal bilaterally Extremities:  No clubbing, cyanosis, edema, or deformity noted with normal full range of motion of all joints.   Neurologic:  No cranial nerve deficits noted. Station and gait are normal. Plantar reflexes are down-going bilaterally. DTRs are symmetrical throughout. Sensory, motor and coordinative functions appear intact. Skin:  Intact without suspicious lesions or rashes Cervical  Nodes:  No lymphadenopathy noted Psych:  Cognition and judgment appear intact. Alert and cooperative with normal attention span and concentration. No apparent delusions, illusions, hallucinations   Impression & Recommendations:  Problem # 1:  CHEST PAIN (ICD-786.50) r/o PE, MI Assessment New  Orders: EKG w/ Interpretation (93000) reviewed - no acute changes T-D-Dimer Fibrin Derivatives Quantitive [phone]) Radiology Referral (Radiology) CT ordered TLB-BMP (Basic Metabolic Panel-BMET) (80048-METABOL) TLB-CBC Platelet - w/Differential (85025-CBCD) TLB-Hepatic/Liver Function Pnl (80076-HEPATIC) TLB-TSH (Thyroid Stimulating Hormone) (84443-TSH) TLB-A1C / Hgb A1C (Glycohemoglobin) (83036-A1C) TLB-CK-MB (Creatine Kinase MB) (82553-CKMB)  Problem # 2:  BRONCHITIS, ACUTE (ICD-466.0) likely causing #1 Assessment: New  His updated medication list for this problem includes:    Levaquin 500 Mg Tabs (Levofloxacin) .[name]... 1 by mouth qd    Promethazine-codeine 6.25-10 Mg/23ml Syrp (Promethazine-codeine) .[name]... 5-10 ml by mouth q id as needed cough    Ciprofloxacin Hcl 500 Mg Tabs (Ciprofloxacin hcl) .[name]... 1 by mouth bid  Problem # 3:  HYPERTENSION (ICD-401.9) Assessment: Unchanged  His updated medication list for this problem includes:    Tribenzor 40-5-12.5 Mg Tabs (Olmesartan-amlodipine-hctz) .[name]... 1 by mouth qd    Doxazosin Mesylate 4 Mg Tabs (Doxazosin mesylate) .[name]... 1or 1/2  by mouth at bedtime  Problem # 4:  DIABETES MELLITUS, TYPE II (ICD-250.00) Assessment: Unchanged  His updated medication list for this problem includes:    Tribenzor 40-5-12.5 Mg Tabs (Olmesartan-amlodipine-hctz) .[name]... 1 by mouth qd    Metformin Hcl 500 Mg Tabs (Metformin hcl) .[name]... 2 tabs  two times a day    Aspirin 81 Mg Tbec (Aspirin) ..... One by mouth every day  Orders: Capillary Blood Glucose/CBG (82948) TLB-A1C / Hgb A1C (Glycohemoglobin) (83036-A1C)  Problem # 5:  Travel Assessment:  New Malarone Cipro  Complete Medication List: 1)  Tribenzor 40-5-12.5 Mg Tabs (Olmesartan-amlodipine-hctz) .[name].. 1 by mouth qd 2)  Doxazosin Mesylate 4 Mg Tabs (Doxazosin mesylate) .[name].. 1or 1/2  by mouth at bedtime 3)  Metformin Hcl 500 Mg Tabs (Metformin hcl) .... 2 tabs  two times a day 4)  Vitamin D3 1000 Unit Tabs (Cholecalciferol) .[name].. 1 by mouth daily 5)  Aspirin 81 Mg Tbec (Aspirin) .... One by mouth every day 6)  Malarone 250-100 Mg Tabs (Atovaquone-proguanil hcl) .[name].. 1 by mouth once daily while in the country and then x 1 wk when back 7)  Tamiflu 75 Mg Caps (Oseltamivir phosphate) .[name].. 1 by mouth bid 8)  Lipitor 20 Mg Tabs (Atorvastatin calcium) .... Take 1 tab by mouth daily 9)  Glucometer Test Strips  .... Use once daily as needed *brand of pt's choice 10)  Accu-chek Softclix Lancets Misc (Lancets) .... Use once daily prn 11)  Zolpidem Tartrate 10 Mg Tabs (Zolpidem tartrate) .... 1/2-1 tab at bedtime as needed insomnia 12)  Sudafed 12 Hour 120 Mg Xr12h-tab (Pseudoephedrine hcl) .[name].. 1 by mouth two times a day as needed allergies 13)  Levaquin 500 Mg Tabs (Levofloxacin) .[name].. 1 by mouth qd 14)  Promethazine-codeine 6.25-10 Mg/70ml Syrp (Promethazine-codeine) .... 5-10 ml by mouth q id as needed cough 15)  Malarone 250-100 Mg Tabs (Atovaquone-proguanil hcl) .[name].. 1 by mouth once daily  start 1 day prior to departure, continue daily while in the country and for 7 days after arrival 16)  Ciprofloxacin Hcl 500 Mg Tabs (Ciprofloxacin hcl) .[name].. 1 by mouth bid  Patient Instructions: 1)  Please schedule a follow-up appointment in 1-2 weeks. 2)  Call if you are not better in a reasonable amount of time or if worse. Go to ER if feeling really bad!  3)  Use over-the-counter medicines for "cold": Tylenol  650mg  or Advil 400mg  every 6 hours  for fever; Delsym or Robutussin for cough. Mucinex or Mucinex D for congestion. Ricola or Halls for sore throat.  Prescriptions: CIPROFLOXACIN HCL 500 MG  TABS (CIPROFLOXACIN HCL) 1 by mouth bid  #20 x 1   Entered and Authorized by:   Tresa Garter MD   Signed by:   Tresa Garter MD on 08/25/2010   Method used:   Print then Give to Patient   RxID:   [id number] MALARONE 250-100 MG TABS (ATOVAQUONE-PROGUANIL HCL) 1 by mouth once daily  start 1 day prior to departure, continue daily while in the country and for 7 days after arrival  #40 x 1   Entered and Authorized by:   Tresa Garter MD   Signed by:   Tresa Garter MD on 08/25/2010   Method used:   Print then Give to Patient   RxID:   [id number] PROMETHAZINE-CODEINE 6.25-10 MG/5ML SYRP (PROMETHAZINE-CODEINE) 5-10 ml by mouth q id as needed cough  #300 ml x 0   Entered and Authorized by:   Tresa Garter MD   Signed by:   Tresa Garter MD on 08/25/2010   Method used:   Print then Give to Patient   RxID:   [id number] LEVAQUIN 500 MG TABS (LEVOFLOXACIN) 1 by mouth qd  #10 x 0   Entered and Authorized by:   Tresa Garter MD   Signed by:   Tresa Garter MD on 08/25/2010   Method used:   Print then Give to Patient   RxID:   [id number]    Orders Added: 1)  Capillary Blood Glucose/CBG [38756] 2)  EKG w/ Interpretation [93000] 3)  T-D-Dimer Fibrin Derivatives Quantitive [43329-51884] 4)  Radiology Referral [Radiology] 5)  TLB-BMP (Basic Metabolic Panel-BMET) [80048-METABOL] 6)  TLB-CBC Platelet - w/Differential [85025-CBCD] 7)  TLB-Hepatic/Liver Function Pnl [80076-HEPATIC] 8)  TLB-TSH (Thyroid Stimulating Hormone) [84443-TSH] 9)  TLB-A1C / Hgb A1C (Glycohemoglobin) [83036-A1C] 10)  TLB-CK-MB (Creatine Kinase MB) [82553-CKMB] 11)  Est. Patient Level V [16606]

## 2010-09-17 ENCOUNTER — Telehealth: Payer: Self-pay | Admitting: Internal Medicine

## 2010-09-25 NOTE — Progress Notes (Signed)
Summary: Rf Prometh/Codeine  Phone Note Refill Request Message from:  Fax from Pharmacy  Refills Requested: Medication #1:  PROMETHAZINE-CODEINE 6.25-10 MG/5ML SYRP 5-10 ml by mouth q id as needed cough   Dosage confirmed as above?Dosage Confirmed   Supply Requested:   Last Refilled: 08/25/2010  Method Requested: Telephone to Pharmacy Initial call taken by: Lanier Prude, [facility]),  September 17, 2010 5:13 PM  Follow-up for Phone Call        ok to ref Follow-up by: Tresa Garter MD,  September 17, 2010 6:17 PM  Additional Follow-up for Phone Call Additional follow up Details #1::        Rx called to pharmacy Additional Follow-up by: Lanier Prude, [facility]),  September 18, 2010 2:10 PM    Prescriptions: PROMETHAZINE-CODEINE 6.25-10 MG/5ML SYRP (PROMETHAZINE-CODEINE) 5-10 ml by mouth q id as needed cough  #300 ml x 0   Entered by:   Lanier Prude, CMA(AAMA)   Authorized by:   Tresa Garter MD   Signed by:   Lanier Prude, CMA(AAMA) on 09/18/2010   Method used:   Telephoned to ...       [facility]* (retail)       [address]       [place], [place]  [address]       Ph: [phone]       Fax: [phone]   RxID:   [id number]

## 2010-10-21 ENCOUNTER — Other Ambulatory Visit: Payer: Self-pay | Admitting: *Deleted

## 2010-10-21 MED ORDER — OLMESARTAN-AMLODIPINE-HCTZ 40-10-12.5 MG PO TABS: 1.0000 | ORAL_TABLET | Freq: Every day | ORAL | Status: DC

## 2010-12-02 NOTE — Assessment & Plan Note (Signed)
[facility]                           PRIMARY CARE OFFICE NOTE   MEHKAI, GALLO                    MRN:          [id number]  DATE:01/05/2007                            DOB:          [birth year]    The patient is 55 year old male who presents for wellness examination.   PAST MEDICAL HISTORY:  1. Hypertension.  2. Type 2 diabetes.  3. Dyslipidemia.   ALLERGIES:  QUININE.   SOCIAL HISTORY:  He is married.  Has a stressful job.  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  Does not smoke or drink alcohol.   CURRENT MEDICATIONS:  1. Amlodipine 5 mg daily.  2. Hydrochlorothiazide 25 mg daily for blood pressure.  3. Ambien at bedtime p.r.n.   REVIEW OF SYSTEMS:  No chest pain or shortness of breath.  Blood sugar  in the 100 range.  Insomnia when travels.  The rest of the 18-point  review of systems is negative.   PHYSICAL EXAMINATION:  Blood pressure 159/100, pulse 66, temperature 99,  weight 192 pounds, he is in no acute distress, focused well.  HEENT:  With moist mucosa.  NECK:  Supple no thyromegaly or bruit.  LUNGS:  Clear, no wheezing or rales.  HEART:  S1-S2, no murmur, no gallop.  ABDOMEN:  Soft, nontender, no organomegaly or masses.  LOWER EXTREMITY:  Without edema.  He is alert, oriented, and cooperative.  Denies being depressed.  SKIN:  Clear.  Normal external genitalia.  RECTAL:  Revealed a normal prostate, stool guaiac negative, no masses.   Labs on December 28, 2006 CBC normal, potassium 3.3, glucose 135, calcium  9.5, creatinine 1.2.  Liver test normal.  Cholesterol 196, LDL 148, PSA  2.39, TSH normal 0.52.  Urinalysis normal.  EKG with normal sinus  rhythm.   ASSESSMENT/PLAN:  1. Normal wellness examination.  Age/health-related issues discussed.      Healthy lifestyle discussed.  He is to schedule colonoscopy when      his travel schedule allows.  Given injection of  dT.  2. International travel.  I refilled his Cipro 500 p.o. b.i.d. p.r.n.      and Malarone to use as directed.  3. Hypertension, not well controlled.  He will continue HCT, will      discontinue Norvasc, start on Azor 40/5 daily.  4. Decreased potassium.  K-Dur 20 mg daily for one month then      discontinue.  We will recheck in 3 months.  5. Dyslipidemia, will recheck.  6. Prostate-specific antigen over 2.0, will recheck in 6-12 months.  7. Diabetes, continue current therapy.  Check A1c in 3 months.     Georgina Quint. Plotnikov, MD  Electronically Signed    AVP/MedQ  DD: 01/06/2007  DT: 01/06/2007  Job #: [id number]

## 2010-12-05 NOTE — Assessment & Plan Note (Signed)
[facility]                              CARDIOLOGY OFFICE NOTE   WHIT, BRUNI                    MRN:          [id number]  DATE:04/29/2006                            DOB:          12-23-1955   CARDIOLOGY CONSULTATION:   REQUESTING PHYSICIAN:  Markham Jordan L. Effie Shy, M.D.   PRIMARY CARE PHYSICIAN:  Georgina Quint. Plotnikov, M.D.   REASON FOR CONSULTATION:  Dizziness.   HISTORY OF PRESENT ILLNESS:  Mr. Filippi is a pleasant 55 year old male with  a history of type 2 diabetes mellitus and hypertension.  He was seen recent  in the emergency department on April 27, 2006 for dizziness.  In taking a  history today, he states that for actually the last 2 weeks he has  experienced mild episodes of vertigo, describing the room spinning, and  states it has been somewhat worse when he is lying in bed on his left side  and moves suddenly.  On the day of presentation to the emergency department,  he had a fairly severe episode of vertigo after he got home from work and  walked upstairs to his computer room.  He felt very dizzy with the room  spinning at that time, and had to lie down suddenly in a chair.  He had no  sensation of chest pain, shortness of breath or loss of consciousness at  that time, and furthermore had no complaint of palpitations.  He ultimately,  after several minutes, called EMS, and was taken to the emergency department  and given oxygen.  There was some concern about his heart rate being slow  with pulse rates documented in the high 40s, and his electrocardiogram  showing sinus bradycardia in the 40s, although this does not seem to be  necessarily a new finding.  There is an electrocardiogram from April 06, 2006 showing sinus bradycardia at 49 beats per minute, and his office notes  show heart rates sometimes in the 50s.  I do note that he has had some  medication adjustments recently.  He states that he was taking Coreg and  verapamil for blood pressure control, but states that the Coreg was not  helping; in fact, he was having problems with headaches and visual changes  while taking Coreg.  This was discontinued, and as of the last office note  on April 13, 2006, it was recommended that he take verapamil and  Tenoretic.  He states that approximately 1 weeks ago he stopped the  verapamil and is taking Tenoretic only, reportedly tolerating this well, and  with good blood pressure control.  Today's heart rate is 49 beats per  minute, confirmed by electrocardiogram showing early repolarization changes.  He denies any typical exertional chest pain or dyspnea on exertion, and  otherwise states that he feels well.  He has had no fevers or chills and  denies any ear pain.  He has had no headaches or visual problems since  stopping Coreg.  He had had no focal weakness.   ALLERGIES:  QUININE.   PRESENT MEDICATIONS:  1. Metformin 500 mg p.o. b.i.d.  2.  Lipitor 10 mg p.o. daily.  3. Aspirin 81 mg p.o. daily.  4. Atenolol/chlorthalidone 100/25 mg p.o. daily.  5. Travatan eye drops.  6. Meclizine 25 mg p.o. b.i.d. given by the emergency department.   PAST MEDICAL HISTORY:  As outlined in the history of present illness.  1. 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  He underwent repeat surgery in mid September of this year by      Dr. Teressa Senter.  He reportedly tolerated surgery and anesthesia well.  2. He has no obvious history of cardiovascular disease.  3. There is a history of retinal detachment in 1995.  4. Previous foot surgery in 1988.  5. Glaucoma diagnosed in 1995.   SOCIAL HISTORY:  The patient is married.  He has 1 child.  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  He denies any tobacco,  alcohol or drug use, and states that before his surgery, approximately 3  weeks  ago, he was exercising 30-45 minutes on a treadmill in the morning  without symptoms.   REVIEW OF SYSTEMS:  As described in the history of present illness.  Otherwise, negative.   FAMILY HISTORY:  Significant for hypertension in the patient's mother who  died at age 55, and hypertension and stroke in the patient's father who died  at age 60.   PHYSICAL EXAMINATION:  VITAL SIGNS:  Weight is 187 pounds, heart rate 49.  Supine blood pressure of 128/88, seated blood pressure 128/91, standing  blood pressure 122/86, standing blood pressure after 2 minutes 122/82, and  standing blood pressure after 5 minutes 119/88.  Heart rate changes from 50  to a high of 51 and a low of 49, and he had only minor dizziness when he  first sat up that lasted for just a few seconds.  GENERAL:  The patient is well nourished and in no acute distress.  HEENT:  Conjunctivae and lids are normal.  There is no nystagmus.  Oropharynx is clear.  NECK:  Supple without elevated jugular venous pressure or loud bruits.  LUNGS:  Clear without labored breathing.  CARDIAC:  A regular rate and rhythm without loud murmur or S3 gallop.  There  is no pericardial rub.  ABDOMEN:  Soft and nontender.  Bowel sounds are present.  EXTREMITIES:  No significant pitting edema.  NEUROPSYCHIATRIC:  The patient is alert and oriented x3.  He has equal  normal strength in all extremities.  Sensation is grossly intact.   IMPRESSION AND RECOMMENDATIONS:  1. Description of dizziness and vertigo with symptoms over the last few      weeks, somewhat positional and arguing for an inner ear      process/labyrinthitis.  He does report improvement since taking      meclizine for the last 24 hours.  He does have concurrent sinus      bradycardia, although this does not look to be clearly related to his      symptoms and more long-standing than his symptomatology.  He has an     appointment scheduled to see Dr. Oliver Barre tomorrow in the primary       care office for further evaluation.  He has no focal neurological      findings today, and I will defer on whether any additional neurological  assessment needs to follow, based on Dr. Raphael Gibney assessment.  He is not      orthostatic today, and I made no medication adjustments, except to say      that for the time being, given his resting bradycardia, using Tenoretic      alone seems reasonably, particularly if his blood pressure is well      controlled.  He may need a potassium supplement along with his diuretic      therapy.  His present symptoms do not seem consistent with a cardiac      etiology.  He is having no chest pain or labored breathing, and his      cardiac markers during his emergency department visit were normal.  His      electrocardiogram shows early repolarization changes.  I would not      anticipate any additional cardiac testing at this time, unless he has      additional symptomatology.  2. We will be available as needed.  The patient will have followup      tomorrow with Dr. Oliver Barre.       Jonelle Sidle, MD     SGM/MedQ  DD:  04/29/2006  DT:  04/29/2006  Job #:  [id number]   cc:   Markham Jordan L. Effie Shy, M.D.  Georgina Quint. Plotnikov, MD

## 2010-12-05 NOTE — Op Note (Signed)
Troy Beck, Troy Beck             ACCOUNT NO.:  [id number]   MEDICAL RECORD NO.:  [id number]          PATIENT TYPE:  AMB   LOCATION:  DSC                          FACILITY:  [facility]   PHYSICIAN:  Katy Fitch. Sypher, M.D. DATE OF BIRTH:  [birth year]   DATE OF PROCEDURE:  04/06/2006  DATE OF DISCHARGE:                                 OPERATIVE REPORT   PREOPERATIVE DIAGNOSIS:  Complex recurrent infiltrating giant-cell tumor,  right long finger, status post prior resection, November 04, 1999.   POSTOPERATIVE DIAGNOSIS:  Extremely invasive complex giant-cell tumor, right  long finger involving pulp, radial neurovascular bundle, flexor digitorum  profundus tendon sheath, volar plate, distal phalangeal proximal metaphysis  and radial diaphysis, distal interphalangeal joint and extension deep to the  extensor mechanism on the dorsum of the finger.   OPERATION:  Wide marginal resection/radical curettage of right long finger  recurrent giant-cell tumor.   OPERATING SURGEON:  Josephine Igo, M.D.   ASSISTANT:  Molly Maduro Dasnoit PA-C.   ANESTHESIA:  Right infraclavicular block, supplemented by IV sedation,  supervising anesthesiologist Dr. Sampson Goon.   INDICATIONS:  Troy Beck is a 55 year old gentleman referred for  evaluation and management of a recurrent mass involving the right long  finger.   In April 2001 he presented for evaluation of a mass consistent with a giant  cell tumor of tendon/joint.   He underwent resection with wide marginal debridement on November 04, 1999.   He states that he had a 4-year interval of no palpable mass.  Approximately  2 years ago he developed a recurrent mass.  He elected to observe this for a  period of time.   He presented for evaluation of his finger in the summer of 2007 and at this  time is noted to have a rather bulbous appearing pulp of the right long  finger involving the radial pulp, radial aspect of the DIP joint and dorsal  aspect of the  finger at the radial nail wall.   An x-ray of the finger documented clear erosion through the base of the  distal phalanx along its radial aspect extending towards the metaphysis  diaphyseal junction of the radial aspect of distal phalanx.   We recommended that we pursue a second attempt at local resection.   His pre operative evaluation included careful review of his past pathology  report.  There were no worrisome features noted with his original biopsy.   It is well recognized the giant-cell tumors could be extremely invasive and  difficult to eradicate.   Given the fact that he has extensive bony involvement and probable joint  involvement,  there is clearly no way to achieve a cellular margin short of  finger amputation at this time.   Preoperatively, I had a lengthy informed consent with Troy Beck, explained  that we would do our best to resect his lesion as aggressively as possible  without sacrificing his bone, tendon, nerve or arterial structures.   He understands at this point in time we will rebiopsy his recurrent mass and  thoroughly debride all visible tumor.  We intend to thoroughly curette the  distal phalanx.  We intend to explore the DIP joint and the flexor tendon  sheath as well as the region deep to the extensor mechanism.   We will send all of the biopsy material for a careful histopathologic  evaluation including a second soft tissue tumor evaluation if necessary.   Should he have recurrence, it may be appropriate for him to be referred to  [facility] for consideration of more aggressive treatment.   Questions were invited and answered in the holding area.   PROCEDURE:  Troy Beck was brought to the operating room and placed in  supine position on the table.   Following anesthesia consult by Dr. Sampson Goon, a infraclavicular block was  placed.  Troy Beck had excellent anesthesia of the right arm.   He was transported to the  operating room, placed in supine position on the  table and under Dr. Sampson Goon supervision, sedation provided.   The right arm was prepped with Betadine soap solution, sterilely draped.  1  gram of Ancef was administered as IV prophylactic antibiotic.  Following  exsanguination of right arm with Esmarch bandage, an arterial tourniquet was  applied to proximal brachium at 280 mmHg due to systolic hypertension.   The procedure commenced with planning of a Mercedes type incision centered  over the radial aspect of the pulp.  A Brunner zigzag incision was fashioned  proximally to expose the radial neurovascular bundle.   The skin incisions taken sharply and the radial proper digital nerve and  artery identified about 6 mm proximal to lesion.  The terminal branches of  the radial proper digital artery and nerve were tented by the mass.   With a very meticulous dissection utilizing micro scissors, forceps and fine  tenotomy scissors, we ultimately dissected all the named branches and  obvious branches of the proper digital nerve and artery off of the mass and  gently retracted them with a micro periosteal elevator.   The mass was subsequently circumferentially dissected across the midline of  the pulp and once its ulnar margin was identified, we removed the mass off  of the insertion of the profundus tendon.  The mass extended deep to the  profundus tendon to the volar plate of the DIP joint and into the distal  phalanx on the radial aspect and distal to the volar plate.  A small portion  entered the DIP joint.  The mass continued through the metaphysis and the  metaphyseal diaphyseal junction along the radial aspect of the distal  phalanx through to the dorsum of the finger.  The mass extended both outside  the radial cortex and through the distal phalanx.   The mass extensively involved the skin ligaments on the radial aspect of the finger.  The mass was removed in large fragments to  protect the  neurovascular bundle.  We ultimately thoroughly debrided the tissues with a  micro curette and a fine rongeur.  A micro curette was used to thoroughly  debride the bone and we removed multiple fragments of tumor from the distal  phalanx with multiple nooks and crannies that were ultimately opened to a  rather large cavity.   After completion of the curettage of the distal phalanx and careful  debridement of the PIP joint.  A micro bipolar forceps was used to irrigate  and electrocauterized with saline thermally desiccating the periosteum and  margins of the tumor cavity.   The subcutaneous tissues were then meticulously dissected of all visible  tumor.  The extensor mechanism was lifted and all tumor deep to the extensor  was removed.   Given the extensive infiltrative nature of this tumor despite the benign  histology, it is quite obvious of this was behaving in an aggressive manner.   We will consult with the pathologist regarding its biologic behavior.   After conclusion of the complete removal of the lesion, hemostasis achieved  with bipolar forceps followed by repair of the wound with corner sutures of  5-0 nylon and mattress sutures of 5-0 nylon.   A compressive finger dressing applied.   Troy Beck tolerated surgery and anesthesia well.  He was transferred to  recovery room with stable vital signs.   For aftercare he is provided description for Percocet 5 mg one p.o. q. 4 to  6 hours as needed for pain, 20 tablets without refill and Keflex 500 mg one  p.o. q.8 h x4 days as prophylactic antibiotic.      Katy Fitch Sypher, M.D.  Electronically Signed     RVS/MEDQ  D:  04/06/2006  T:  04/07/2006  Job:  [id number]   cc:   Georgina Quint. Plotnikov, MD

## 2010-12-17 ENCOUNTER — Other Ambulatory Visit: Payer: Self-pay | Admitting: *Deleted

## 2010-12-17 MED ORDER — ATORVASTATIN CALCIUM 20 MG PO TABS: 20.0000 mg | ORAL_TABLET | Freq: Every day | ORAL | Status: DC

## 2010-12-17 MED ORDER — METFORMIN HCL 500 MG PO TABS: 500.0000 mg | ORAL_TABLET | Freq: Two times a day (BID) | ORAL | Status: DC

## 2010-12-18 ENCOUNTER — Telehealth: Payer: Self-pay | Admitting: *Deleted

## 2010-12-18 NOTE — Telephone Encounter (Signed)
2 bid thx

## 2010-12-18 NOTE — Telephone Encounter (Signed)
rec fax re: Metformin 500mg  stating pt has always taken 2 po bid. Rx sent in on 12-17-10 states 1 po bid. Which is correct? If 2 bid send in 3 mo supply.

## 2010-12-19 MED ORDER — METFORMIN HCL 500 MG PO TABS: 500.0000 mg | ORAL_TABLET | Freq: Two times a day (BID) | ORAL | Status: DC

## 2010-12-19 NOTE — Telephone Encounter (Signed)
Pharmacy informed new rx sent

## 2011-01-26 ENCOUNTER — Other Ambulatory Visit: Payer: Self-pay | Admitting: *Deleted

## 2011-01-26 MED ORDER — DOXAZOSIN MESYLATE 4 MG PO TABS: 2.0000 mg | ORAL_TABLET | Freq: Every day | ORAL | Status: DC

## 2011-07-24 ENCOUNTER — Other Ambulatory Visit: Payer: Self-pay | Admitting: *Deleted

## 2011-07-24 MED ORDER — OLMESARTAN-AMLODIPINE-HCTZ 40-10-12.5 MG PO TABS: 1.0000 | ORAL_TABLET | Freq: Every day | ORAL | Status: DC

## 2011-07-30 ENCOUNTER — Other Ambulatory Visit: Payer: Self-pay | Admitting: *Deleted

## 2011-07-30 MED ORDER — DOXAZOSIN MESYLATE 4 MG PO TABS: 2.0000 mg | ORAL_TABLET | Freq: Every day | ORAL | Status: DC

## 2011-07-31 ENCOUNTER — Ambulatory Visit: Payer: BC Managed Care – PPO | Admitting: Internal Medicine

## 2011-08-18 ENCOUNTER — Other Ambulatory Visit: Payer: Self-pay | Admitting: *Deleted

## 2011-08-18 MED ORDER — GLUCOSE BLOOD VI STRP: 1.0000 | ORAL_STRIP | Freq: Two times a day (BID) | Status: DC | PRN

## 2011-08-18 MED ORDER — FREESTYLE LANCETS MISC: 1.0000 | Freq: Two times a day (BID) | Status: DC | PRN

## 2011-09-01 ENCOUNTER — Ambulatory Visit: Payer: BC Managed Care – PPO | Admitting: Internal Medicine

## 2011-09-01 DIAGNOSIS — Z0289 Encounter for other administrative examinations: Secondary | ICD-10-CM

## 2011-12-08 ENCOUNTER — Ambulatory Visit: Payer: BC Managed Care – PPO | Admitting: Internal Medicine

## 2011-12-11 ENCOUNTER — Encounter: Payer: Self-pay | Admitting: Internal Medicine

## 2011-12-11 ENCOUNTER — Ambulatory Visit (INDEPENDENT_AMBULATORY_CARE_PROVIDER_SITE_OTHER): Payer: BC Managed Care – PPO | Admitting: Internal Medicine

## 2011-12-11 VITALS — BP 140/90 | HR 60 | Temp 97.7°F | Resp 16 | Wt 199.0 lb

## 2011-12-11 DIAGNOSIS — Z23 Encounter for immunization: Secondary | ICD-10-CM

## 2011-12-11 DIAGNOSIS — E785 Hyperlipidemia, unspecified: Secondary | ICD-10-CM

## 2011-12-11 DIAGNOSIS — G4700 Insomnia, unspecified: Secondary | ICD-10-CM

## 2011-12-11 DIAGNOSIS — R202 Paresthesia of skin: Secondary | ICD-10-CM

## 2011-12-11 DIAGNOSIS — I10 Essential (primary) hypertension: Secondary | ICD-10-CM

## 2011-12-11 DIAGNOSIS — N401 Enlarged prostate with lower urinary tract symptoms: Secondary | ICD-10-CM

## 2011-12-11 DIAGNOSIS — Z0000 Encounter for general adult medical examination without abnormal findings: Secondary | ICD-10-CM

## 2011-12-11 DIAGNOSIS — R209 Unspecified disturbances of skin sensation: Secondary | ICD-10-CM

## 2011-12-11 DIAGNOSIS — E119 Type 2 diabetes mellitus without complications: Secondary | ICD-10-CM

## 2011-12-11 MED ORDER — OSELTAMIVIR PHOSPHATE 75 MG PO CAPS: 75.0000 mg | ORAL_CAPSULE | Freq: Two times a day (BID) | ORAL | Status: AC

## 2011-12-11 MED ORDER — OLMESARTAN-AMLODIPINE-HCTZ 40-10-12.5 MG PO TABS: 1.0000 | ORAL_TABLET | Freq: Every day | ORAL | Status: DC

## 2011-12-11 MED ORDER — VITAMIN D 1000 UNITS PO TABS: 1000.0000 [IU] | ORAL_TABLET | Freq: Every day | ORAL | Status: AC

## 2011-12-11 MED ORDER — METFORMIN HCL 500 MG PO TABS: 500.0000 mg | ORAL_TABLET | Freq: Two times a day (BID) | ORAL | Status: DC

## 2011-12-11 MED ORDER — DOXAZOSIN MESYLATE 4 MG PO TABS: 2.0000 mg | ORAL_TABLET | Freq: Every day | ORAL | Status: DC

## 2011-12-11 MED ORDER — ATORVASTATIN CALCIUM 20 MG PO TABS: 20.0000 mg | ORAL_TABLET | Freq: Every day | ORAL | Status: DC

## 2011-12-11 MED ORDER — CIPROFLOXACIN HCL 500 MG PO TABS: 500.0000 mg | ORAL_TABLET | Freq: Two times a day (BID) | ORAL | Status: AC

## 2011-12-11 NOTE — Patient Instructions (Signed)
BP Readings from Last 3 Encounters:  12/11/11 140/90  08/25/10 150/98  12/05/09 150/100   Wt Readings from Last 3 Encounters:  12/11/11 199 lb (90.266 kg)  08/25/10 194 lb (87.998 kg)  12/05/09 192 lb (87.091 kg)

## 2011-12-11 NOTE — Assessment & Plan Note (Signed)
Not well controlled

## 2011-12-11 NOTE — Assessment & Plan Note (Signed)
Continue with current prescription therapy as reflected on the Med list.  

## 2011-12-11 NOTE — Progress Notes (Signed)
  Subjective:    Patient ID: Troy Beck, male    DOB: Jun 13, 1956, 56 y.o.   MRN: [id number]  HPI The patient is here for a wellness exam. The patient has been doing well overall without major physical or psychological issues going on lately. The patient needs to address  chronic hypertension that has been well controlled with medicines; to address chronic  hyperlipidemia controlled with medicines as well; and to address type 2 chronic diabetes, controlled with medical treatment and diet.   Wt Readings from Last 3 Encounters:  12/11/11 199 lb (90.266 kg)  08/25/10 194 lb (87.998 kg)  12/05/09 192 lb (87.091 kg)    BP Readings from Last 3 Encounters:  12/11/11 140/90  08/25/10 150/98  12/05/09 150/100      Review of Systems  Constitutional: Positive for unexpected weight change. Negative for appetite change and fatigue.  HENT: Negative for nosebleeds, congestion, sore throat, sneezing, trouble swallowing and neck pain.   Eyes: Negative for itching and visual disturbance.  Respiratory: Negative for cough.   Cardiovascular: Negative for chest pain, palpitations and leg swelling.  Gastrointestinal: Negative for nausea, diarrhea, blood in stool and abdominal distention.  Genitourinary: Negative for frequency and hematuria.  Musculoskeletal: Negative for back pain, joint swelling and gait problem.  Skin: Negative for rash.  Neurological: Negative for dizziness, tremors, speech difficulty and weakness.  Psychiatric/Behavioral: Negative for sleep disturbance, dysphoric mood and agitation. The patient is not nervous/anxious.        Objective:   Physical Exam  Constitutional: He is oriented to person, place, and time. He appears well-developed.  HENT:  Mouth/Throat: Oropharynx is clear and moist.  Eyes: Conjunctivae are normal. Pupils are equal, round, and reactive to light.  Neck: Normal range of motion. No JVD present. No thyromegaly present.  Cardiovascular: Normal rate,  regular rhythm, normal heart sounds and intact distal pulses.  Exam reveals no gallop and no friction rub.   No murmur heard. Pulmonary/Chest: Effort normal and breath sounds normal. No respiratory distress. He has no wheezes. He has no rales. He exhibits no tenderness.  Abdominal: Soft. Bowel sounds are normal. He exhibits no distension and no mass. There is no tenderness. There is no rebound and no guarding.  Musculoskeletal: Normal range of motion. He exhibits no edema and no tenderness.  Lymphadenopathy:    He has no cervical adenopathy.  Neurological: He is alert and oriented to person, place, and time. He has normal reflexes. No cranial nerve deficit. He exhibits normal muscle tone. Coordination normal.  Skin: Skin is warm and dry. No rash noted.  Psychiatric: He has a normal mood and affect. His behavior is normal. Judgment and thought content normal.   Lab Results  Component Value Date   WBC 5.1 08/25/2010   HGB 14.4 08/25/2010   HCT 42.5 08/25/2010   PLT 203.0 08/25/2010   GLUCOSE 99 08/25/2010   CHOL 185 10/22/2008   TRIG 63.0 10/22/2008   HDL 47.40 10/22/2008   LDLCALC 125* 10/22/2008   ALT 17 08/25/2010   AST 20 08/25/2010   NA 135 08/25/2010   K 4.6 08/25/2010   CL 98 08/25/2010   CREATININE 1.3 08/25/2010   BUN 19 08/25/2010   CO2 30 08/25/2010   TSH 0.72 08/25/2010   PSA 2.39 12/28/2006   HGBA1C 6.7* 08/25/2010   MICROALBUR 223.3* 12/05/2009          Assessment & Plan:

## 2011-12-11 NOTE — Assessment & Plan Note (Addendum)
We discussed age appropriate health related issues, including available/recomended screening tests and vaccinations. We discussed a need for adhering to healthy diet and exercise. Labs/EKG were reviewed/ordered. All questions were answered. Loose wt  

## 2011-12-11 NOTE — Assessment & Plan Note (Signed)
Continue with current prescription therapy as reflected on the Med list. Labs  

## 2011-12-14 NOTE — Assessment & Plan Note (Signed)
Continue with current prescription therapy as reflected on the Med list.  

## 2011-12-15 ENCOUNTER — Other Ambulatory Visit (INDEPENDENT_AMBULATORY_CARE_PROVIDER_SITE_OTHER): Payer: BC Managed Care – PPO

## 2011-12-15 DIAGNOSIS — E119 Type 2 diabetes mellitus without complications: Secondary | ICD-10-CM

## 2011-12-15 DIAGNOSIS — Z0000 Encounter for general adult medical examination without abnormal findings: Secondary | ICD-10-CM

## 2011-12-15 DIAGNOSIS — R209 Unspecified disturbances of skin sensation: Secondary | ICD-10-CM

## 2011-12-15 DIAGNOSIS — E785 Hyperlipidemia, unspecified: Secondary | ICD-10-CM

## 2011-12-15 DIAGNOSIS — R202 Paresthesia of skin: Secondary | ICD-10-CM

## 2011-12-15 DIAGNOSIS — I10 Essential (primary) hypertension: Secondary | ICD-10-CM

## 2011-12-15 LAB — HEPATIC FUNCTION PANEL
Albumin: 3.8 g/dL (ref 3.5–5.2)
Total Protein: 7.2 g/dL (ref 6.0–8.3)

## 2011-12-15 LAB — CBC WITH DIFFERENTIAL/PLATELET
Basophils Relative: 1.1 % (ref 0.0–3.0)
Eosinophils Relative: 5.6 % — ABNORMAL HIGH (ref 0.0–5.0)
Hemoglobin: 14.1 g/dL (ref 13.0–17.0)
Lymphocytes Relative: 42.4 % (ref 12.0–46.0)
MCHC: 33.1 g/dL (ref 30.0–36.0)
Monocytes Relative: 9.8 % (ref 3.0–12.0)
Neutro Abs: 2 10*3/uL (ref 1.4–7.7)
Neutrophils Relative %: 41.1 % — ABNORMAL LOW (ref 43.0–77.0)
RBC: 4.94 Mil/uL (ref 4.22–5.81)
WBC: 4.8 10*3/uL (ref 4.5–10.5)

## 2011-12-15 LAB — LIPID PANEL
Cholesterol: 117 mg/dL (ref 0–200)
HDL: 37.5 mg/dL — ABNORMAL LOW (ref >39.00)
LDL Cholesterol: 69 mg/dL (ref 0–99)
Total CHOL/HDL Ratio: 3
Triglycerides: 52 mg/dL (ref 0.0–149.0)
VLDL: 10.4 mg/dL (ref 0.0–40.0)

## 2011-12-15 LAB — URINALYSIS
Ketones, ur: NEGATIVE
Specific Gravity, Urine: 1.01 (ref 1.000–1.030)
Total Protein, Urine: NEGATIVE
Urine Glucose: NEGATIVE
Urobilinogen, UA: 1 (ref 0.0–1.0)
pH: 7.5 (ref 5.0–8.0)

## 2011-12-15 LAB — BASIC METABOLIC PANEL
CO2: 29 mEq/L (ref 19–32)
Calcium: 9.6 mg/dL (ref 8.4–10.5)
GFR: 63.43 mL/min (ref >60.00)
Sodium: 137 mEq/L (ref 135–145)

## 2011-12-15 LAB — VITAMIN B12: Vitamin B-12: 860 pg/mL (ref 211–911)

## 2011-12-15 LAB — PSA: PSA: 1.22 ng/mL (ref 0.10–4.00)

## 2011-12-15 LAB — TSH: TSH: 0.71 u[IU]/mL (ref 0.35–5.50)

## 2011-12-18 ENCOUNTER — Telehealth: Payer: Self-pay | Admitting: Internal Medicine

## 2011-12-18 NOTE — Telephone Encounter (Signed)
Left mess for patient to call back.  

## 2011-12-18 NOTE — Telephone Encounter (Signed)
Pt advised of lab result and a copy of his labs have also been mailed to his home.

## 2011-12-18 NOTE — Telephone Encounter (Signed)
Troy Beck, please, inform patient that all labs are normal except for a little elev sugars. Loose 5-10 lbs  Please, mail the labs to the patient.    Thx

## 2012-02-01 ENCOUNTER — Telehealth: Payer: Self-pay | Admitting: *Deleted

## 2012-02-01 MED ORDER — ZOLPIDEM TARTRATE 10 MG PO TABS: 5.0000 mg | ORAL_TABLET | Freq: Every evening | ORAL | Status: DC | PRN

## 2012-02-01 NOTE — Telephone Encounter (Signed)
OK to fill this prescription with additional refills x5 Thank you!  

## 2012-02-01 NOTE — Telephone Encounter (Signed)
Rf req for Zolpidem 10 mg 1/2-1 po qhs prn. #30. Ok to Rf?

## 2012-02-01 NOTE — Telephone Encounter (Signed)
Done

## 2012-04-12 ENCOUNTER — Ambulatory Visit: Payer: BC Managed Care – PPO | Admitting: Internal Medicine

## 2012-04-12 DIAGNOSIS — Z0289 Encounter for other administrative examinations: Secondary | ICD-10-CM

## 2012-08-22 ENCOUNTER — Encounter: Payer: Self-pay | Admitting: Internal Medicine

## 2012-08-22 ENCOUNTER — Ambulatory Visit (INDEPENDENT_AMBULATORY_CARE_PROVIDER_SITE_OTHER): Payer: Self-pay | Admitting: Internal Medicine

## 2012-08-22 VITALS — BP 140/90 | HR 72 | Temp 98.8°F | Resp 16 | Ht 68.0 in | Wt 200.0 lb

## 2012-08-22 DIAGNOSIS — M533 Sacrococcygeal disorders, not elsewhere classified: Secondary | ICD-10-CM

## 2012-08-22 DIAGNOSIS — Z23 Encounter for immunization: Secondary | ICD-10-CM

## 2012-08-22 DIAGNOSIS — Z789 Other specified health status: Secondary | ICD-10-CM

## 2012-08-22 DIAGNOSIS — Z0000 Encounter for general adult medical examination without abnormal findings: Secondary | ICD-10-CM

## 2012-08-22 DIAGNOSIS — E119 Type 2 diabetes mellitus without complications: Secondary | ICD-10-CM

## 2012-08-22 DIAGNOSIS — G4700 Insomnia, unspecified: Secondary | ICD-10-CM

## 2012-08-22 DIAGNOSIS — E785 Hyperlipidemia, unspecified: Secondary | ICD-10-CM

## 2012-08-22 DIAGNOSIS — N529 Male erectile dysfunction, unspecified: Secondary | ICD-10-CM

## 2012-08-22 DIAGNOSIS — I10 Essential (primary) hypertension: Secondary | ICD-10-CM

## 2012-08-22 LAB — GLUCOSE, POCT (MANUAL RESULT ENTRY): POC Glucose: 133 mg/dl — AB (ref 70–99)

## 2012-08-22 MED ORDER — DOXAZOSIN MESYLATE 4 MG PO TABS: 4.0000 mg | ORAL_TABLET | Freq: Every day | ORAL | Status: DC

## 2012-08-22 MED ORDER — TADALAFIL 5 MG PO TABS: 5.0000 mg | ORAL_TABLET | Freq: Every day | ORAL | Status: DC | PRN

## 2012-08-22 MED ORDER — METFORMIN HCL 500 MG PO TABS: 500.0000 mg | ORAL_TABLET | Freq: Two times a day (BID) | ORAL | Status: DC

## 2012-08-22 MED ORDER — OLMESARTAN-AMLODIPINE-HCTZ 40-10-12.5 MG PO TABS: 1.0000 | ORAL_TABLET | Freq: Every day | ORAL | Status: DC

## 2012-08-22 MED ORDER — ATOVAQUONE-PROGUANIL HCL 250-100 MG PO TABS: ORAL_TABLET | ORAL | Status: DC

## 2012-08-22 MED ORDER — ZOLPIDEM TARTRATE 10 MG PO TABS: 5.0000 mg | ORAL_TABLET | Freq: Every evening | ORAL | Status: DC | PRN

## 2012-08-22 MED ORDER — NAPROXEN 500 MG PO TABS: 500.0000 mg | ORAL_TABLET | Freq: Two times a day (BID) | ORAL | Status: DC

## 2012-08-22 NOTE — Patient Instructions (Signed)
-   Wedge pillow

## 2012-08-22 NOTE — Assessment & Plan Note (Signed)
We discussed age appropriate health related issues, including available/recomended screening tests and vaccinations. We discussed a need for adhering to healthy diet and exercise. Labs/EKG were reviewed/ordered. All questions were answered.   

## 2012-08-22 NOTE — Assessment & Plan Note (Signed)
Continue with current prescription therapy as reflected on the Med list.  

## 2012-08-22 NOTE — Assessment & Plan Note (Signed)
-   Wedge pillow

## 2012-08-22 NOTE — Progress Notes (Signed)
Subjective:    HPI   The patient is here for a wellness exam. The patient has been doing well overall without major physical or psychological issues going on lately. The patient needs to address  chronic hypertension that has been well controlled with medicines; to address chronic  hyperlipidemia controlled with medicines as well; and to address type 2 chronic diabetes, controlled with medical treatment and diet. C/o pain in the tail bone 5-6/10 x 3-4 wks C/o ED x months   Wt Readings from Last 3 Encounters:  08/22/12 200 lb (90.719 kg)  12/11/11 199 lb (90.266 kg)  08/25/10 194 lb (87.998 kg)    BP Readings from Last 3 Encounters:  08/22/12 140/90  12/11/11 140/90  08/25/10 150/98      Review of Systems  Constitutional: Positive for unexpected weight change. Negative for appetite change and fatigue.  HENT: Negative for nosebleeds, congestion, sore throat, sneezing, trouble swallowing and neck pain.   Eyes: Negative for itching and visual disturbance.  Respiratory: Negative for cough.   Cardiovascular: Negative for chest pain, palpitations and leg swelling.  Gastrointestinal: Negative for nausea, diarrhea, blood in stool and abdominal distention.  Genitourinary: Negative for frequency and hematuria.  Musculoskeletal: Negative for back pain, joint swelling and gait problem.  Skin: Negative for rash.  Neurological: Negative for dizziness, tremors, speech difficulty and weakness.  Psychiatric/Behavioral: Negative for sleep disturbance, dysphoric mood and agitation. The patient is not nervous/anxious.        Objective:   Physical Exam  Constitutional: He is oriented to person, place, and time. He appears well-developed.  HENT:  Mouth/Throat: Oropharynx is clear and moist.  Eyes: Conjunctivae normal are normal. Pupils are equal, round, and reactive to light.  Neck: Normal range of motion. No JVD present. No thyromegaly present.  Cardiovascular: Normal rate, regular  rhythm, normal heart sounds and intact distal pulses.  Exam reveals no gallop and no friction rub.   No murmur heard. Pulmonary/Chest: Effort normal and breath sounds normal. No respiratory distress. He has no wheezes. He has no rales. He exhibits no tenderness.  Abdominal: Soft. Bowel sounds are normal. He exhibits no distension and no mass. There is no tenderness. There is no rebound and no guarding.  Genitourinary: Rectum normal and penis normal. Guaiac negative stool.       Testes - soft B Prostate 1+ NT  Musculoskeletal: Normal range of motion. He exhibits no edema and no tenderness.  Lymphadenopathy:    He has no cervical adenopathy.  Neurological: He is alert and oriented to person, place, and time. He has normal reflexes. No cranial nerve deficit. He exhibits normal muscle tone. Coordination normal.  Skin: Skin is warm and dry. No rash noted.  Psychiatric: He has a normal mood and affect. His behavior is normal. Judgment and thought content normal.  Tail bone is tender to palpation  Lab Results  Component Value Date   WBC 4.8 12/15/2011   HGB 14.1 12/15/2011   HCT 42.6 12/15/2011   PLT 193.0 12/15/2011   GLUCOSE 111* 12/15/2011   CHOL 117 12/15/2011   TRIG 52.0 12/15/2011   HDL 37.50* 12/15/2011   LDLCALC 69 12/15/2011   ALT 16 12/15/2011   AST 19 12/15/2011   NA 137 12/15/2011   K 4.6 12/15/2011   CL 100 12/15/2011   CREATININE 1.5 12/15/2011   BUN 18 12/15/2011   CO2 29 12/15/2011   TSH 0.71 12/15/2011   PSA 1.22 12/15/2011   HGBA1C 7.1* 12/15/2011  MICROALBUR 223.3* 12/05/2009          Assessment & Plan:

## 2012-08-22 NOTE — Assessment & Plan Note (Signed)
Daily cialis

## 2012-08-22 NOTE — Assessment & Plan Note (Signed)
Labs  Continue with current prescription therapy as reflected on the Med list.  

## 2012-08-23 NOTE — Assessment & Plan Note (Signed)
2/14 [place] [facility]

## 2012-08-23 NOTE — Assessment & Plan Note (Signed)
Continue with current prescription therapy as reflected on the Med list.  

## 2012-09-05 ENCOUNTER — Telehealth: Payer: Self-pay | Admitting: Internal Medicine

## 2012-09-05 ENCOUNTER — Other Ambulatory Visit (INDEPENDENT_AMBULATORY_CARE_PROVIDER_SITE_OTHER): Payer: Self-pay

## 2012-09-05 DIAGNOSIS — E785 Hyperlipidemia, unspecified: Secondary | ICD-10-CM

## 2012-09-05 DIAGNOSIS — N529 Male erectile dysfunction, unspecified: Secondary | ICD-10-CM

## 2012-09-05 DIAGNOSIS — E119 Type 2 diabetes mellitus without complications: Secondary | ICD-10-CM

## 2012-09-05 DIAGNOSIS — R197 Diarrhea, unspecified: Secondary | ICD-10-CM

## 2012-09-05 DIAGNOSIS — G4700 Insomnia, unspecified: Secondary | ICD-10-CM

## 2012-09-05 DIAGNOSIS — Z0000 Encounter for general adult medical examination without abnormal findings: Secondary | ICD-10-CM

## 2012-09-05 DIAGNOSIS — M533 Sacrococcygeal disorders, not elsewhere classified: Secondary | ICD-10-CM

## 2012-09-05 DIAGNOSIS — Z789 Other specified health status: Secondary | ICD-10-CM

## 2012-09-05 LAB — CBC WITH DIFFERENTIAL/PLATELET
Basophils Relative: 1.5 % (ref 0.0–3.0)
Eosinophils Relative: 16 % — ABNORMAL HIGH (ref 0.0–5.0)
HCT: 42.1 % (ref 39.0–52.0)
Hemoglobin: 14 g/dL (ref 13.0–17.0)
Lymphocytes Relative: 38.7 % (ref 12.0–46.0)
Monocytes Relative: 8.8 % (ref 3.0–12.0)
Neutro Abs: 2 10*3/uL (ref 1.4–7.7)
RBC: 4.95 Mil/uL (ref 4.22–5.81)

## 2012-09-05 LAB — URINALYSIS, ROUTINE W REFLEX MICROSCOPIC
Bilirubin Urine: NEGATIVE
Hgb urine dipstick: NEGATIVE
Leukocytes, UA: NEGATIVE
Nitrite: NEGATIVE
Total Protein, Urine: 30
Urobilinogen, UA: 0.2 (ref 0.0–1.0)

## 2012-09-05 LAB — HEMOGLOBIN A1C: Hgb A1c MFr Bld: 7 % — ABNORMAL HIGH (ref 4.6–6.5)

## 2012-09-05 LAB — TSH: TSH: 0.92 u[IU]/mL (ref 0.35–5.50)

## 2012-09-05 LAB — LIPID PANEL
HDL: 34.4 mg/dL — ABNORMAL LOW (ref >39.00)
Total CHOL/HDL Ratio: 3
VLDL: 10.4 mg/dL (ref 0.0–40.0)

## 2012-09-05 LAB — HEPATIC FUNCTION PANEL
AST: 24 U/L (ref 0–37)
Alkaline Phosphatase: 43 U/L (ref 39–117)
Bilirubin, Direct: 0.2 mg/dL (ref 0.0–0.3)
Total Protein: 7.2 g/dL (ref 6.0–8.3)

## 2012-09-05 LAB — BASIC METABOLIC PANEL
Calcium: 9.6 mg/dL (ref 8.4–10.5)
GFR: 76.28 mL/min (ref >60.00)
Potassium: 4.2 mEq/L (ref 3.5–5.1)
Sodium: 139 mEq/L (ref 135–145)

## 2012-09-05 NOTE — Telephone Encounter (Signed)
Troy Beck, please, inform patient that all labs are normal except for elev eosinophills in CBC - it could be a sign of intestinal parasites. Please order stool for O&P  Thx

## 2012-09-06 ENCOUNTER — Other Ambulatory Visit: Payer: Self-pay

## 2012-09-06 DIAGNOSIS — R197 Diarrhea, unspecified: Secondary | ICD-10-CM

## 2012-09-06 NOTE — Telephone Encounter (Signed)
Pt informed. Lab ordered.

## 2012-09-07 ENCOUNTER — Other Ambulatory Visit: Payer: Self-pay

## 2012-09-26 ENCOUNTER — Telehealth: Payer: Self-pay

## 2012-09-26 NOTE — Telephone Encounter (Signed)
Left message on machine for pt to return my call  

## 2012-09-26 NOTE — Telephone Encounter (Signed)
Pt called requesting results if recent stool study, please advise

## 2012-09-26 NOTE — Telephone Encounter (Signed)
No parasites. Yeasts were noted - usually of no significance Take Align probiotic 1 a day x 4 wks (OTC). RTC if sx's Thx

## 2012-09-26 NOTE — Telephone Encounter (Signed)
Left detailed mess informing pt of below.  

## 2012-11-07 ENCOUNTER — Telehealth: Payer: Self-pay | Admitting: Internal Medicine

## 2012-11-07 ENCOUNTER — Encounter: Payer: Self-pay | Admitting: *Deleted

## 2012-11-07 ENCOUNTER — Other Ambulatory Visit: Payer: Self-pay | Admitting: *Deleted

## 2012-11-07 MED ORDER — ATORVASTATIN CALCIUM 20 MG PO TABS: 20.0000 mg | ORAL_TABLET | Freq: Every day | ORAL | Status: DC

## 2012-11-07 NOTE — Telephone Encounter (Signed)
Not sure. Pls sch OV Thx

## 2012-11-07 NOTE — Telephone Encounter (Signed)
Patient Information:  Caller Name: Jodey  Phone: [phone]  Patient: Troy Beck, Troy Beck  Gender: Male  DOB: 09-Jan-1956  Age: 57 Years  PCP: Plotnikov, Alex (Adults only)  Office Follow Up:  Does the office need to follow up with this patient?: No  Instructions For The Office: N/A   Symptoms  Reason For Call & Symptoms: Patient is calling concerning right arm "vibration".  Starts from shoulder to fingers. Lasting "several seconds" and the stops. Episodes x5 daily. No pain , no swelling,  Denies any injury  Reviewed Health History In EMR: Yes  Reviewed Medications In EMR: Yes  Reviewed Allergies In EMR: Yes  Reviewed Surgeries / Procedures: Yes  Date of Onset of Symptoms: 10/07/2012  Guideline(s) Used:  Arm Pain  Disposition Per Guideline:   See Today or Tomorrow in Office  Reason For Disposition Reached:   Patient wants to be seen  Advice Given:  Rest:   You should try to avoid any exercise or activity that caused this pain for the next 3 days.  Pain Medicines:  For pain relief, you can take either acetaminophen, ibuprofen, or naproxen.  They are over-the-counter (OTC) pain drugs. You can buy them at the drugstore.  Acetaminophen (e.g., Tylenol):  Regular Strength Tylenol: Take 650 mg (two 325 mg pills) by mouth every 4-6 hours as needed. Each Regular Strength Tylenol pill has 325 mg of acetaminophen.  Ibuprofen (e.g., Motrin, Advil):  Take 400 mg (two 200 mg pills) by mouth every 6 hours.  Call Back If:  You become worse.  Patient Will Follow Care Advice:  YES  Appointment Scheduled:  11/08/2012 11:00:00 Appointment Scheduled Provider:  Sonda Primes (Adults only)

## 2012-11-08 ENCOUNTER — Ambulatory Visit (INDEPENDENT_AMBULATORY_CARE_PROVIDER_SITE_OTHER): Payer: BC Managed Care – PPO | Admitting: Internal Medicine

## 2012-11-08 ENCOUNTER — Encounter: Payer: Self-pay | Admitting: Internal Medicine

## 2012-11-08 VITALS — BP 120/90 | HR 68 | Temp 98.2°F | Resp 16 | Wt 197.0 lb

## 2012-11-08 DIAGNOSIS — R202 Paresthesia of skin: Secondary | ICD-10-CM | POA: Insufficient documentation

## 2012-11-08 DIAGNOSIS — I10 Essential (primary) hypertension: Secondary | ICD-10-CM

## 2012-11-08 DIAGNOSIS — E119 Type 2 diabetes mellitus without complications: Secondary | ICD-10-CM

## 2012-11-08 DIAGNOSIS — R209 Unspecified disturbances of skin sensation: Secondary | ICD-10-CM

## 2012-11-08 MED ORDER — METHYLPREDNISOLONE ACETATE 80 MG/ML IJ SUSP
80.0000 mg | Freq: Once | INTRAMUSCULAR | Status: AC
  Administered 2012-11-08: 80 mg via INTRAMUSCULAR

## 2012-11-08 NOTE — Progress Notes (Signed)
   Subjective:    HPI  C/o periodic R arm "going to sleep" sensation, "vibratiom" sensation x 2 wks off and on. It has started after a flight from [place]  The patient needs to address  chronic hypertension that has been well controlled with medicines; to address chronic  hyperlipidemia controlled with medicines as well; and to address type 2 chronic diabetes, controlled with medical treatment and diet.    Wt Readings from Last 3 Encounters:  11/08/12 197 lb (89.359 kg)  08/22/12 200 lb (90.719 kg)  12/11/11 199 lb (90.266 kg)    BP Readings from Last 3 Encounters:  11/08/12 120/90  08/22/12 140/90  12/11/11 140/90      Review of Systems  Constitutional: Positive for unexpected weight change. Negative for appetite change and fatigue.  HENT: Negative for nosebleeds, congestion, sore throat, sneezing, trouble swallowing and neck pain.   Eyes: Negative for itching and visual disturbance.  Respiratory: Negative for cough.   Cardiovascular: Negative for chest pain, palpitations and leg swelling.  Gastrointestinal: Negative for nausea, diarrhea, blood in stool and abdominal distention.  Genitourinary: Negative for frequency and hematuria.  Musculoskeletal: Negative for back pain, joint swelling and gait problem.  Skin: Negative for rash.  Neurological: Negative for dizziness, tremors, speech difficulty and weakness.  Psychiatric/Behavioral: Negative for sleep disturbance, dysphoric mood and agitation. The patient is not nervous/anxious.        Objective:   Physical Exam  Constitutional: He is oriented to person, place, and time. He appears well-developed.  HENT:  Mouth/Throat: Oropharynx is clear and moist.  Eyes: Conjunctivae are normal. Pupils are equal, round, and reactive to light.  Neck: Normal range of motion. No JVD present. No thyromegaly present.  Cardiovascular: Normal rate, regular rhythm, normal heart sounds and intact distal pulses.  Exam reveals no gallop and no  friction rub.   No murmur heard. Pulmonary/Chest: Effort normal and breath sounds normal. No respiratory distress. He has no wheezes. He has no rales. He exhibits no tenderness.  Abdominal: Soft. Bowel sounds are normal. He exhibits no distension and no mass. There is no tenderness. There is no rebound and no guarding.  Genitourinary: Rectum normal and penis normal. Guaiac negative stool.  Testes - soft B Prostate 1+ NT  Musculoskeletal: Normal range of motion. He exhibits no edema and no tenderness.  Lymphadenopathy:    He has no cervical adenopathy.  Neurological: He is alert and oriented to person, place, and time. He has normal reflexes. No cranial nerve deficit. He exhibits normal muscle tone. Coordination normal.  Skin: Skin is warm and dry. No rash noted.  Psychiatric: He has a normal mood and affect. His behavior is normal. Judgment and thought content normal.  Tail bone is tender to palpation  Lab Results  Component Value Date   WBC 5.6 09/05/2012   HGB 14.0 09/05/2012   HCT 42.1 09/05/2012   PLT 206.0 09/05/2012   GLUCOSE 122* 09/05/2012   CHOL 110 09/05/2012   TRIG 52.0 09/05/2012   HDL 34.40* 09/05/2012   LDLCALC 65 09/05/2012   ALT 22 09/05/2012   AST 24 09/05/2012   NA 139 09/05/2012   K 4.2 09/05/2012   CL 103 09/05/2012   CREATININE 1.3 09/05/2012   BUN 18 09/05/2012   CO2 30 09/05/2012   TSH 0.92 09/05/2012   PSA 1.20 09/05/2012   HGBA1C 7.0* 09/05/2012   MICROALBUR 223.3* 12/05/2009          Assessment & Plan:

## 2012-11-08 NOTE — Assessment & Plan Note (Signed)
Xray Cont pillow Traction if needed Call if not better

## 2012-11-08 NOTE — Assessment & Plan Note (Signed)
Continue with current prescription therapy as reflected on the Med list.  

## 2012-11-08 NOTE — Patient Instructions (Signed)
Memory foam contour neck pillow (small, thin)

## 2012-12-06 ENCOUNTER — Ambulatory Visit: Payer: Self-pay | Admitting: Internal Medicine

## 2012-12-06 DIAGNOSIS — Z0289 Encounter for other administrative examinations: Secondary | ICD-10-CM

## 2012-12-15 ENCOUNTER — Telehealth: Payer: Self-pay | Admitting: Internal Medicine

## 2012-12-15 NOTE — Telephone Encounter (Signed)
I called pt to see what med was needed. I spoke to hs wife and she states he has already received med and to disregard previous message.

## 2012-12-15 NOTE — Telephone Encounter (Signed)
Pt called req an eye drop to be call into [facility] in [place]. Please check.

## 2013-05-08 ENCOUNTER — Telehealth: Payer: Self-pay | Admitting: Internal Medicine

## 2013-05-08 NOTE — Telephone Encounter (Signed)
05/08/2013   Pt is requesting a refill for Rx::   zolpidem (AMBIEN) 10 MG tablet [[id number]]

## 2013-05-08 NOTE — Telephone Encounter (Signed)
OK to fill this prescription with additional refills x2 Thank you!  

## 2013-05-08 NOTE — Telephone Encounter (Signed)
The patient's wife called the triage line and stated she needed a rx refill for the patient. I tried to call the pt's wife to clarify which rx she needed refilled, but the wife did not answer.

## 2013-05-09 ENCOUNTER — Telehealth: Payer: Self-pay

## 2013-05-09 MED ORDER — ZOLPIDEM TARTRATE 10 MG PO TABS: 5.0000 mg | ORAL_TABLET | Freq: Every evening | ORAL | Status: DC | PRN

## 2013-05-09 NOTE — Addendum Note (Signed)
Addended by: Merrilyn Puma on: 05/09/2013 09:18 AM   Modules accepted: Orders

## 2013-05-09 NOTE — Telephone Encounter (Signed)
Received incoming fax from pharmacy for refill on Zolpidem 0.5 mg. Patient was last seen on 11/08/2012 and the medication was last ordered on 08/22/2012. Please advise.  Thanks!

## 2013-05-09 NOTE — Telephone Encounter (Signed)
Already addressed Thx

## 2013-05-09 NOTE — Telephone Encounter (Signed)
Done

## 2013-08-25 ENCOUNTER — Other Ambulatory Visit: Payer: Self-pay | Admitting: *Deleted

## 2013-08-25 MED ORDER — OLMESARTAN-AMLODIPINE-HCTZ 40-10-12.5 MG PO TABS: 1.0000 | ORAL_TABLET | Freq: Every day | ORAL | Status: DC

## 2013-08-25 MED ORDER — DOXAZOSIN MESYLATE 4 MG PO TABS: 4.0000 mg | ORAL_TABLET | Freq: Every day | ORAL | Status: DC

## 2013-08-25 MED ORDER — METFORMIN HCL 500 MG PO TABS: 500.0000 mg | ORAL_TABLET | Freq: Two times a day (BID) | ORAL | Status: DC

## 2013-12-12 ENCOUNTER — Other Ambulatory Visit: Payer: Self-pay | Admitting: *Deleted

## 2013-12-12 MED ORDER — ATORVASTATIN CALCIUM 20 MG PO TABS: 20.0000 mg | ORAL_TABLET | Freq: Every day | ORAL | Status: DC

## 2013-12-19 ENCOUNTER — Telehealth: Payer: Self-pay | Admitting: *Deleted

## 2013-12-19 NOTE — Telephone Encounter (Signed)
Tribenzor 40-10-12.5 mg PA is approved 12/14/13 until 07/19/2038. Pt informed

## 2014-01-18 ENCOUNTER — Telehealth: Payer: Self-pay | Admitting: *Deleted

## 2014-01-18 NOTE — Telephone Encounter (Signed)
Rf req for Zolpidem 10 mg 1/2-1 po qhs prn. # 30 .Last OV was 10/2012. No existing appts. Ok to Rf?

## 2014-01-18 NOTE — Telephone Encounter (Signed)
Pt left vm requesting Malaria meds for his upcoming trip to [place]. Please advise.

## 2014-01-19 NOTE — Telephone Encounter (Signed)
OK to fill this prescription with additional refills x1. Sch OV Thank you!

## 2014-01-22 MED ORDER — ZOLPIDEM TARTRATE 10 MG PO TABS: 5.0000 mg | ORAL_TABLET | Freq: Every evening | ORAL | Status: DC | PRN

## 2014-01-22 MED ORDER — ATOVAQUONE-PROGUANIL HCL 250-100 MG PO TABS: ORAL_TABLET | ORAL | Status: DC

## 2014-01-22 NOTE — Telephone Encounter (Signed)
Done

## 2014-02-07 ENCOUNTER — Telehealth: Payer: Self-pay | Admitting: *Deleted

## 2014-02-07 MED ORDER — ATOVAQUONE-PROGUANIL HCL 250-100 MG PO TABS: 1.0000 | ORAL_TABLET | Freq: Every day | ORAL | Status: DC

## 2014-02-07 NOTE — Telephone Encounter (Signed)
Sent new script to [facility]. Called pt wife no answer LMOM rx has been sent...Troy Beck

## 2014-02-07 NOTE — Telephone Encounter (Signed)
Wife states md sent rx in for maleria pills. He need med for 3 months not 3 weeks. Requesting additional med...Troy Beck

## 2014-02-07 NOTE — Telephone Encounter (Signed)
Ok to correct Thx

## 2014-05-11 ENCOUNTER — Other Ambulatory Visit: Payer: Self-pay | Admitting: *Deleted

## 2014-05-11 MED ORDER — ATORVASTATIN CALCIUM 20 MG PO TABS: 20.0000 mg | ORAL_TABLET | Freq: Every day | ORAL | Status: DC

## 2014-05-16 ENCOUNTER — Encounter: Payer: BC Managed Care – PPO | Admitting: Internal Medicine

## 2014-05-16 DIAGNOSIS — Z0289 Encounter for other administrative examinations: Secondary | ICD-10-CM

## 2014-05-17 ENCOUNTER — Encounter: Payer: BC Managed Care – PPO | Admitting: Internal Medicine

## 2014-05-17 DIAGNOSIS — Z0289 Encounter for other administrative examinations: Secondary | ICD-10-CM

## 2014-05-25 ENCOUNTER — Telehealth: Payer: Self-pay | Admitting: Internal Medicine

## 2014-05-25 NOTE — Telephone Encounter (Signed)
Pt spouse came by office requesting Rx refills for TRIBENZOR, ATORVASTATIN, BRIMONIDINE and TRAVATAN. Pt has future appt scheduled for January with Dr Alain Marion but is completely out of these meds at this time. Pt will also be going out of town next week. Please contact pt when request is reviewed.

## 2014-05-25 NOTE — Telephone Encounter (Signed)
OK to fill 90 days prescriptions with additional refills x0 Hoa many "no show" did he have? Thank you!

## 2014-05-28 MED ORDER — ATORVASTATIN CALCIUM 20 MG PO TABS: 20.0000 mg | ORAL_TABLET | Freq: Every day | ORAL | Status: DC

## 2014-05-28 MED ORDER — OLMESARTAN-AMLODIPINE-HCTZ 40-10-12.5 MG PO TABS: 1.0000 | ORAL_TABLET | Freq: Every day | ORAL | Status: DC

## 2014-05-28 MED ORDER — ATOVAQUONE-PROGUANIL HCL 250-100 MG PO TABS: 1.0000 | ORAL_TABLET | Freq: Every day | ORAL | Status: DC

## 2014-05-28 NOTE — Telephone Encounter (Signed)
Sent refill except for travatan not on his medication list. Pt had (2) no shows. He is schedule for his CPX 07/24/14...Troy Beck

## 2014-07-24 ENCOUNTER — Encounter: Payer: Self-pay | Admitting: Internal Medicine

## 2014-07-24 ENCOUNTER — Other Ambulatory Visit: Payer: Self-pay | Admitting: Internal Medicine

## 2014-07-24 ENCOUNTER — Ambulatory Visit (INDEPENDENT_AMBULATORY_CARE_PROVIDER_SITE_OTHER): Payer: Self-pay | Admitting: *Deleted

## 2014-07-24 ENCOUNTER — Ambulatory Visit (INDEPENDENT_AMBULATORY_CARE_PROVIDER_SITE_OTHER): Payer: BC Managed Care – PPO | Admitting: Internal Medicine

## 2014-07-24 VITALS — BP 170/118 | HR 75 | Temp 98.2°F | Resp 12 | Ht 68.0 in | Wt 195.0 lb

## 2014-07-24 DIAGNOSIS — Z7184 Encounter for health counseling related to travel: Secondary | ICD-10-CM | POA: Insufficient documentation

## 2014-07-24 DIAGNOSIS — Z0000 Encounter for general adult medical examination without abnormal findings: Secondary | ICD-10-CM

## 2014-07-24 DIAGNOSIS — Z7189 Other specified counseling: Secondary | ICD-10-CM

## 2014-07-24 DIAGNOSIS — I10 Essential (primary) hypertension: Secondary | ICD-10-CM

## 2014-07-24 DIAGNOSIS — E785 Hyperlipidemia, unspecified: Secondary | ICD-10-CM

## 2014-07-24 DIAGNOSIS — J45909 Unspecified asthma, uncomplicated: Secondary | ICD-10-CM | POA: Insufficient documentation

## 2014-07-24 DIAGNOSIS — Z23 Encounter for immunization: Secondary | ICD-10-CM

## 2014-07-24 DIAGNOSIS — E119 Type 2 diabetes mellitus without complications: Secondary | ICD-10-CM

## 2014-07-24 DIAGNOSIS — J4520 Mild intermittent asthma, uncomplicated: Secondary | ICD-10-CM

## 2014-07-24 DIAGNOSIS — G4700 Insomnia, unspecified: Secondary | ICD-10-CM

## 2014-07-24 MED ORDER — VITAMIN D 1000 UNITS PO TABS: 1000.0000 [IU] | ORAL_TABLET | Freq: Every day | ORAL | Status: AC

## 2014-07-24 MED ORDER — ALBUTEROL SULFATE HFA 108 (90 BASE) MCG/ACT IN AERS: 2.0000 | INHALATION_SPRAY | Freq: Four times a day (QID) | RESPIRATORY_TRACT | Status: DC | PRN

## 2014-07-24 MED ORDER — GLUCOSE BLOOD VI STRP: ORAL_STRIP | Status: DC

## 2014-07-24 MED ORDER — OLMESARTAN-AMLODIPINE-HCTZ 40-10-12.5 MG PO TABS: 1.0000 | ORAL_TABLET | Freq: Every day | ORAL | Status: DC

## 2014-07-24 MED ORDER — ZOLPIDEM TARTRATE 10 MG PO TABS: 5.0000 mg | ORAL_TABLET | Freq: Every evening | ORAL | Status: DC | PRN

## 2014-07-24 MED ORDER — DOXAZOSIN MESYLATE 4 MG PO TABS: 4.0000 mg | ORAL_TABLET | Freq: Every day | ORAL | Status: DC

## 2014-07-24 MED ORDER — METFORMIN HCL 500 MG PO TABS: 500.0000 mg | ORAL_TABLET | Freq: Two times a day (BID) | ORAL | Status: DC

## 2014-07-24 MED ORDER — ATORVASTATIN CALCIUM 20 MG PO TABS: 20.0000 mg | ORAL_TABLET | Freq: Every day | ORAL | Status: DC

## 2014-07-24 MED ORDER — ONETOUCH DELICA LANCETS FINE MISC: 1.0000 | Freq: Every day | Status: DC | PRN

## 2014-07-24 MED ORDER — FLUTICASONE FUROATE-VILANTEROL 100-25 MCG/INH IN AEPB: 1.0000 | INHALATION_SPRAY | Freq: Every day | RESPIRATORY_TRACT | Status: DC

## 2014-07-24 MED ORDER — VITAMIN D 1000 UNITS PO TABS: 1000.0000 [IU] | ORAL_TABLET | Freq: Every day | ORAL | Status: DC

## 2014-07-24 MED ORDER — ATOVAQUONE-PROGUANIL HCL 250-100 MG PO TABS: 1.0000 | ORAL_TABLET | Freq: Every day | ORAL | Status: DC

## 2014-07-24 MED ORDER — TADALAFIL 5 MG PO TABS: 5.0000 mg | ORAL_TABLET | Freq: Every day | ORAL | Status: DC | PRN

## 2014-07-24 NOTE — Assessment & Plan Note (Signed)
Continue with current prescription therapy as reflected on the Med list.  

## 2014-07-24 NOTE — Assessment & Plan Note (Signed)
Re-start current prescription therapy as reflected on the Med list.

## 2014-07-24 NOTE — Assessment & Plan Note (Signed)
Chronic Ran out of meds Labs

## 2014-07-24 NOTE — Progress Notes (Signed)
Pre visit review using our clinic review tool, if applicable. No additional management support is needed unless otherwise documented below in the visit note. 

## 2014-07-24 NOTE — Assessment & Plan Note (Addendum)
Continue with current prescription therapy as reflected on the Med list. Will try [facility] Rx for [employer]

## 2014-07-24 NOTE — Assessment & Plan Note (Signed)
Frequent travel to [place] and [place] Flu shot Malaria prophylaxis w/Malarone

## 2014-07-24 NOTE — Assessment & Plan Note (Addendum)
Worse. Re-start prescription therapy as reflected on the Med list.

## 2014-07-24 NOTE — Progress Notes (Signed)
Subjective:    HPI   The patient is here for a wellness exam. He ran out of meds... C/o asthma at times (no asthma sx's in [place] or [place])  The patient needs to address  chronic hypertension that has been well controlled with medicines; to address chronic  hyperlipidemia controlled with medicines as well; and to address type 2 chronic diabetes, controlled with medical treatment and diet.   Wt Readings from Last 3 Encounters:  07/24/14 195 lb (88.451 kg)  11/08/12 197 lb (89.359 kg)  08/22/12 200 lb (90.719 kg)    BP Readings from Last 3 Encounters:  07/24/14 170/118  11/08/12 120/90  08/22/12 140/90      Review of Systems  Constitutional: Positive for unexpected weight change. Negative for appetite change and fatigue.  HENT: Negative for congestion, nosebleeds, sneezing, sore throat and trouble swallowing.   Eyes: Negative for itching and visual disturbance.  Respiratory: Negative for cough.   Cardiovascular: Negative for chest pain, palpitations and leg swelling.  Gastrointestinal: Negative for nausea, diarrhea, blood in stool and abdominal distention.  Genitourinary: Negative for frequency and hematuria.  Musculoskeletal: Negative for back pain, joint swelling, gait problem and neck pain.  Skin: Negative for rash.  Neurological: Negative for dizziness, tremors, speech difficulty and weakness.  Psychiatric/Behavioral: Negative for sleep disturbance, dysphoric mood and agitation. The patient is not nervous/anxious.        Objective:   Physical Exam  Constitutional: He is oriented to person, place, and time. He appears well-developed and well-nourished. No distress.  HENT:  Head: Normocephalic and atraumatic.  Right Ear: External ear normal.  Left Ear: External ear normal.  Nose: Nose normal.  Mouth/Throat: Oropharynx is clear and moist. No oropharyngeal exudate.  Eyes: Conjunctivae and EOM are normal. Pupils are equal, round, and reactive to light. Right eye  exhibits no discharge. Left eye exhibits no discharge. No scleral icterus.  Neck: Normal range of motion. Neck supple. No JVD present. No tracheal deviation present. No thyromegaly present.  Cardiovascular: Normal rate, regular rhythm, normal heart sounds and intact distal pulses.  Exam reveals no gallop and no friction rub.   No murmur heard. Pulmonary/Chest: Effort normal and breath sounds normal. No stridor. No respiratory distress. He has no wheezes. He has no rales. He exhibits no tenderness.  Abdominal: Soft. Bowel sounds are normal. He exhibits no distension and no mass. There is no tenderness. There is no rebound and no guarding.  Genitourinary: Rectum normal, prostate normal and penis normal. Guaiac negative stool. No penile tenderness.  Musculoskeletal: Normal range of motion. He exhibits no edema or tenderness.  Lymphadenopathy:    He has no cervical adenopathy.  Neurological: He is alert and oriented to person, place, and time. He has normal reflexes. No cranial nerve deficit. He exhibits normal muscle tone. Coordination normal.  Skin: Skin is warm and dry. No rash noted. He is not diaphoretic. No erythema. No pallor.  Psychiatric: He has a normal mood and affect. His behavior is normal. Judgment and thought content normal.    Lab Results  Component Value Date   WBC 5.6 09/05/2012   HGB 14.0 09/05/2012   HCT 42.1 09/05/2012   PLT 206.0 09/05/2012   GLUCOSE 122* 09/05/2012   CHOL 110 09/05/2012   TRIG 52.0 09/05/2012   HDL 34.40* 09/05/2012   LDLCALC 65 09/05/2012   ALT 22 09/05/2012   AST 24 09/05/2012   NA 139 09/05/2012   K 4.2 09/05/2012   CL 103 09/05/2012  CREATININE 1.3 09/05/2012   BUN 18 09/05/2012   CO2 30 09/05/2012   TSH 0.92 09/05/2012   PSA 1.20 09/05/2012   HGBA1C 7.0* 09/05/2012   MICROALBUR 223.3* 12/05/2009    I personally provided Breo inhaler use teaching. After the teaching patient was able to demonstrate it's use effectively. All questions  were answered       Assessment & Plan:

## 2014-07-25 ENCOUNTER — Telehealth: Payer: Self-pay | Admitting: Internal Medicine

## 2014-07-25 NOTE — Telephone Encounter (Signed)
emmi emailed °

## 2014-08-23 ENCOUNTER — Telehealth: Payer: Self-pay | Admitting: *Deleted

## 2014-08-23 NOTE — Telephone Encounter (Signed)
Is Mefloquin covered? It is inferior to Attov/Prog for prophylaxis Thx

## 2014-08-23 NOTE — Telephone Encounter (Signed)
I called ins and spoke to [facility]. Pt's plan will not cover Atovaquone/Proguamil 250-100 mg unless the pt has malaria. Please advise.

## 2014-08-24 NOTE — Telephone Encounter (Signed)
We need to send a new Rx to see or advise to pay OOP. Please advise

## 2014-08-29 NOTE — Telephone Encounter (Signed)
Pls ask the pt. Thx

## 2014-08-30 NOTE — Telephone Encounter (Signed)
Tried calling pt. His cell phone vm is full and the home number we have for pt is not working. Will try again later.

## 2014-09-11 NOTE — Telephone Encounter (Signed)
Closing encounter CMA was not able to get in touch with pt...Troy Beck

## 2014-12-13 ENCOUNTER — Telehealth: Payer: Self-pay | Admitting: Internal Medicine

## 2014-12-13 NOTE — Telephone Encounter (Signed)
Pt called and wanted to know if Dr Camila Li could call something else in other then the Olmesartan-Amlodipine-HCTZ [facility]) 40-10-12.5 MG TABS [[id number]].  He can not afford this med.

## 2014-12-14 MED ORDER — LOSARTAN POTASSIUM-HCTZ 100-25 MG PO TABS: 1.0000 | ORAL_TABLET | Freq: Every day | ORAL | Status: DC

## 2014-12-14 MED ORDER — AMLODIPINE BESYLATE 10 MG PO TABS: 10.0000 mg | ORAL_TABLET | Freq: Every day | ORAL | Status: DC

## 2014-12-14 NOTE — Telephone Encounter (Signed)
Will use Losartan HCT and Norvasc instead Thx

## 2014-12-14 NOTE — Telephone Encounter (Signed)
Pt informed and contact number updated.

## 2015-01-03 ENCOUNTER — Telehealth: Payer: Self-pay | Admitting: Internal Medicine

## 2015-01-03 MED ORDER — ATOVAQUONE-PROGUANIL HCL 250-100 MG PO TABS: 1.0000 | ORAL_TABLET | Freq: Every day | ORAL | Status: DC

## 2015-01-03 NOTE — Telephone Encounter (Signed)
Ok Done Thx 

## 2015-01-03 NOTE — Telephone Encounter (Signed)
Pt called in and said that he is leaving for [place]  tomorrow and needs malaria medication sent to pharmacy today?   [facility] on [address] [phone]

## 2015-01-04 NOTE — Telephone Encounter (Signed)
I called [facility] and pt paid for # 30 OOP. I advised pharmacy PA will not be approved per pt's ins. See phone note dated 08/23/14.

## 2015-01-04 NOTE — Telephone Encounter (Signed)
Rec fax from pharmacy stating Malarone needs PA.

## 2015-01-22 ENCOUNTER — Ambulatory Visit: Payer: Self-pay | Admitting: Internal Medicine

## 2015-06-08 ENCOUNTER — Other Ambulatory Visit: Payer: Self-pay | Admitting: Internal Medicine

## 2015-06-10 NOTE — Telephone Encounter (Signed)
pls sch ROV

## 2015-08-06 ENCOUNTER — Other Ambulatory Visit: Payer: Self-pay | Admitting: Internal Medicine

## 2015-08-15 ENCOUNTER — Other Ambulatory Visit: Payer: Self-pay | Admitting: Internal Medicine

## 2015-11-20 ENCOUNTER — Other Ambulatory Visit: Payer: Self-pay | Admitting: Internal Medicine

## 2015-11-25 ENCOUNTER — Other Ambulatory Visit: Payer: Self-pay | Admitting: Internal Medicine

## 2015-12-10 ENCOUNTER — Telehealth: Payer: Self-pay | Admitting: Internal Medicine

## 2015-12-10 MED ORDER — LOSARTAN POTASSIUM-HCTZ 100-25 MG PO TABS: 1.0000 | ORAL_TABLET | Freq: Every day | ORAL | Status: DC

## 2015-12-10 MED ORDER — AMLODIPINE BESYLATE 10 MG PO TABS: 10.0000 mg | ORAL_TABLET | Freq: Every day | ORAL | Status: DC

## 2015-12-10 MED ORDER — ATORVASTATIN CALCIUM 20 MG PO TABS: 20.0000 mg | ORAL_TABLET | Freq: Every day | ORAL | Status: DC

## 2015-12-10 NOTE — Telephone Encounter (Signed)
All maintenance meds sent.  See meds.  Ok to Rf Zolpidem?

## 2015-12-10 NOTE — Telephone Encounter (Signed)
Patient has scheduled appt for 6/5.  Patient is out of metformin, lipitor, amlodipine, zolpidem and losartan.  He is requesting this to be sent to [facility] in  [place].

## 2015-12-11 MED ORDER — ZOLPIDEM TARTRATE 10 MG PO TABS: 5.0000 mg | ORAL_TABLET | Freq: Every evening | ORAL | Status: DC | PRN

## 2015-12-11 NOTE — Telephone Encounter (Signed)
Ok to ref all Thx 

## 2015-12-11 NOTE — Telephone Encounter (Signed)
Done. See meds.  

## 2015-12-17 ENCOUNTER — Other Ambulatory Visit: Payer: Self-pay | Admitting: Internal Medicine

## 2015-12-17 NOTE — Telephone Encounter (Signed)
Patient states the pharmacys has yet to get any of the refill request. Can you please send them again. Thank you!

## 2015-12-23 ENCOUNTER — Encounter: Payer: Self-pay | Admitting: Internal Medicine

## 2016-01-10 ENCOUNTER — Other Ambulatory Visit: Payer: Self-pay | Admitting: *Deleted

## 2016-01-10 MED ORDER — ATORVASTATIN CALCIUM 20 MG PO TABS: 20.0000 mg | ORAL_TABLET | Freq: Every day | ORAL | Status: DC

## 2016-01-10 MED ORDER — METFORMIN HCL 500 MG PO TABS: ORAL_TABLET | ORAL | Status: DC

## 2016-01-10 NOTE — Telephone Encounter (Signed)
Pt left msg on triage yesterday afternoon stating he is currently out of his medications. Have appt 6/26, needing refill until appt. Notified pt sent 30 day to [place]...Johny Chess

## 2016-01-13 ENCOUNTER — Encounter: Payer: Self-pay | Admitting: Internal Medicine

## 2016-01-13 ENCOUNTER — Ambulatory Visit (INDEPENDENT_AMBULATORY_CARE_PROVIDER_SITE_OTHER): Payer: Self-pay | Admitting: Internal Medicine

## 2016-01-13 VITALS — BP 100/60 | HR 91 | Ht 68.0 in | Wt 195.0 lb

## 2016-01-13 DIAGNOSIS — Z0000 Encounter for general adult medical examination without abnormal findings: Secondary | ICD-10-CM

## 2016-01-13 DIAGNOSIS — I10 Essential (primary) hypertension: Secondary | ICD-10-CM

## 2016-01-13 DIAGNOSIS — Z23 Encounter for immunization: Secondary | ICD-10-CM

## 2016-01-13 DIAGNOSIS — R202 Paresthesia of skin: Secondary | ICD-10-CM

## 2016-01-13 DIAGNOSIS — G4700 Insomnia, unspecified: Secondary | ICD-10-CM

## 2016-01-13 DIAGNOSIS — Z7184 Encounter for health counseling related to travel: Secondary | ICD-10-CM

## 2016-01-13 DIAGNOSIS — E119 Type 2 diabetes mellitus without complications: Secondary | ICD-10-CM

## 2016-01-13 DIAGNOSIS — Z7189 Other specified counseling: Secondary | ICD-10-CM

## 2016-01-13 MED ORDER — TADALAFIL 5 MG PO TABS: 5.0000 mg | ORAL_TABLET | Freq: Every day | ORAL | Status: DC | PRN

## 2016-01-13 MED ORDER — NAPROXEN 500 MG PO TABS: 500.0000 mg | ORAL_TABLET | Freq: Two times a day (BID) | ORAL | Status: DC

## 2016-01-13 MED ORDER — FLUTICASONE FUROATE-VILANTEROL 100-25 MCG/INH IN AEPB: 1.0000 | INHALATION_SPRAY | Freq: Every day | RESPIRATORY_TRACT | Status: DC

## 2016-01-13 MED ORDER — DOXAZOSIN MESYLATE 4 MG PO TABS: 4.0000 mg | ORAL_TABLET | Freq: Every day | ORAL | Status: DC

## 2016-01-13 MED ORDER — ZOLPIDEM TARTRATE 10 MG PO TABS: 5.0000 mg | ORAL_TABLET | Freq: Every evening | ORAL | Status: DC | PRN

## 2016-01-13 MED ORDER — LOSARTAN POTASSIUM-HCTZ 100-25 MG PO TABS: 1.0000 | ORAL_TABLET | Freq: Every day | ORAL | Status: DC

## 2016-01-13 MED ORDER — METFORMIN HCL 500 MG PO TABS: ORAL_TABLET | ORAL | Status: DC

## 2016-01-13 MED ORDER — ATOVAQUONE-PROGUANIL HCL 250-100 MG PO TABS: 1.0000 | ORAL_TABLET | Freq: Every day | ORAL | Status: DC

## 2016-01-13 MED ORDER — AMLODIPINE BESYLATE 10 MG PO TABS: 10.0000 mg | ORAL_TABLET | Freq: Every day | ORAL | Status: DC

## 2016-01-13 MED ORDER — ATORVASTATIN CALCIUM 20 MG PO TABS: 20.0000 mg | ORAL_TABLET | Freq: Every day | ORAL | Status: DC

## 2016-01-13 MED ORDER — ALBUTEROL SULFATE HFA 108 (90 BASE) MCG/ACT IN AERS: 2.0000 | INHALATION_SPRAY | Freq: Four times a day (QID) | RESPIRATORY_TRACT | Status: DC | PRN

## 2016-01-13 NOTE — Assessment & Plan Note (Signed)
Malarone

## 2016-01-13 NOTE — Assessment & Plan Note (Signed)
Chronic On Metformin 

## 2016-01-13 NOTE — Assessment & Plan Note (Signed)
We discussed age appropriate health related issues, including available/recomended screening tests and vaccinations. We discussed a need for adhering to healthy diet and exercise. Labs/EKG were reviewed/ordered. All questions were answered. Cologuard 

## 2016-01-13 NOTE — Assessment & Plan Note (Signed)
Recurrent - likely - cervical radiculopathy Neck pillow

## 2016-01-13 NOTE — Assessment & Plan Note (Signed)
Travel related Zolpidem prn  Potential benefits of a long term benzodiazepines  use as well as potential risks  and complications were explained to the patient and were aknowledged.

## 2016-01-13 NOTE — Progress Notes (Signed)
Pre visit review using our clinic review tool, if applicable. No additional management support is needed unless otherwise documented below in the visit note. 

## 2016-01-13 NOTE — Progress Notes (Signed)
Subjective:  Patient ID: Troy Beck, male    DOB: Nov 21, 1955  Age: 60 y.o. MRN: [id number]  CC: Annual Exam   HPI Troy Beck presents for a well exam. F/u HTN, dyslipidemia, asthma. He is traveling to [place] 2-3 times per year and to [place] 2-3/year  Outpatient Prescriptions Prior to Visit  Medication Sig Dispense Refill  . glucose blood (ONETOUCH VERIO) test strip Use as instructed 100 each 11  . mometasone (NASONEX) 50 MCG/ACT nasal spray Place 2 sprays into the nose daily.    Troy Beck DELICA LANCETS FINE MISC 1 Device by Does not apply route daily as needed. 100 each 3  . albuterol (PROVENTIL HFA;VENTOLIN HFA) 108 (90 BASE) MCG/ACT inhaler Inhale 2 puffs into the lungs every 6 (six) hours as needed for wheezing or shortness of breath. 1 Inhaler 11  . amLODipine (NORVASC) 10 MG tablet Take 1 tablet (10 mg total) by mouth daily. 30 tablet 0  . atorvastatin (LIPITOR) 20 MG tablet Take 1 tablet (20 mg total) by mouth daily. Keep 01/13/16 appt for future refills 30 tablet 0  . atovaquone-proguanil (MALARONE) 250-100 MG TABS Take 1 tablet by mouth daily. 90 tablet 1  . doxazosin (CARDURA) 4 MG tablet TAKE 1 TABLET AT BEDTIME 90 tablet 2  . Fluticasone Furoate-Vilanterol (BREO ELLIPTA) 100-25 MCG/INH AEPB Inhale 1 Act into the lungs daily. 1 each 5  . losartan-hydrochlorothiazide (HYZAAR) 100-25 MG tablet Take 1 tablet by mouth daily. 30 tablet 0  . metFORMIN (GLUCOPHAGE) 500 MG tablet TAKE 1 TABLET BY MOUTH TWICE A DAY WITH A MEAL 30 tablet 0  . naproxen (NAPROSYN) 500 MG tablet Take 1 tablet (500 mg total) by mouth 2 (two) times daily with a meal. 60 tablet 1  . tadalafil (CIALIS) 5 MG tablet Take 1 tablet (5 mg total) by mouth daily as needed for erectile dysfunction. 90 tablet 3  . zolpidem (AMBIEN) 10 MG tablet Take 0.5-1 tablets (5-10 mg total) by mouth at bedtime as needed for sleep. 90 tablet 0   No facility-administered medications prior to visit.    ROS Review of  Systems  Constitutional: Positive for fatigue. Negative for appetite change and unexpected weight change.  HENT: Negative for congestion, nosebleeds, sneezing, sore throat and trouble swallowing.   Eyes: Negative for itching and visual disturbance.  Respiratory: Negative for cough and shortness of breath.   Cardiovascular: Negative for chest pain, palpitations and leg swelling.  Gastrointestinal: Negative for nausea, diarrhea, blood in stool and abdominal distention.  Genitourinary: Negative for frequency and hematuria.  Musculoskeletal: Negative for back pain, joint swelling, gait problem and neck pain.  Skin: Negative for rash.  Neurological: Negative for dizziness, tremors, speech difficulty and weakness.  Psychiatric/Behavioral: Positive for sleep disturbance. Negative for dysphoric mood, decreased concentration and agitation. The patient is not nervous/anxious.     Objective:  BP 100/60 mmHg  Pulse 91  Ht 5\' 8"  (1.727 m)  Wt 195 lb (88.451 kg)  BMI 29.66 kg/m2  SpO2 96%  BP Readings from Last 3 Encounters:  01/13/16 100/60  07/24/14 170/118  11/08/12 120/90    Wt Readings from Last 3 Encounters:  01/13/16 195 lb (88.451 kg)  07/24/14 195 lb (88.451 kg)  11/08/12 197 lb (89.359 kg)    Physical Exam  Constitutional: He is oriented to person, place, and time. He appears well-developed. No distress.  NAD  HENT:  Mouth/Throat: Oropharynx is clear and moist.  Eyes: Conjunctivae are normal. Pupils are equal, round,  and reactive to light.  Neck: Normal range of motion. No JVD present. No thyromegaly present.  Cardiovascular: Normal rate, regular rhythm, normal heart sounds and intact distal pulses.  Exam reveals no gallop and no friction rub.   No murmur heard. Pulmonary/Chest: Effort normal and breath sounds normal. No respiratory distress. He has no wheezes. He has no rales. He exhibits no tenderness.  Abdominal: Soft. Bowel sounds are normal. He exhibits no distension and  no mass. There is no tenderness. There is no rebound and no guarding.  Musculoskeletal: Normal range of motion. He exhibits no edema or tenderness.  Lymphadenopathy:    He has no cervical adenopathy.  Neurological: He is alert and oriented to person, place, and time. He has normal reflexes. No cranial nerve deficit. He exhibits normal muscle tone. He displays a negative Romberg sign. Coordination and gait normal.  Skin: Skin is warm and dry. No rash noted.  Psychiatric: He has a normal mood and affect. His behavior is normal. Judgment and thought content normal.     Procedure: EKG Indication: well exam, dyslipidemia Impression: NSR. No acute changes.   Lab Results  Component Value Date   WBC 5.6 09/05/2012   HGB 14.0 09/05/2012   HCT 42.1 09/05/2012   PLT 206.0 09/05/2012   GLUCOSE 122* 09/05/2012   CHOL 110 09/05/2012   TRIG 52.0 09/05/2012   HDL 34.40* 09/05/2012   LDLCALC 65 09/05/2012   ALT 22 09/05/2012   AST 24 09/05/2012   NA 139 09/05/2012   K 4.2 09/05/2012   CL 103 09/05/2012   CREATININE 1.3 09/05/2012   BUN 18 09/05/2012   CO2 30 09/05/2012   TSH 0.92 09/05/2012   PSA 1.20 09/05/2012   HGBA1C 7.0* 09/05/2012   MICROALBUR 223.3* 12/05/2009    Ct Angio Chest W/cm &/or Wo Cm  08/25/2010  *RADIOLOGY REPORT* Clinical Data:  Chest pain and shortness of breath.  Productive cough. CT ANGIOGRAPHY CHEST WITH CONTRAST Technique:  Multidetector CT imaging of the chest was performed using the standard protocol during bolus administration of intravenous contrast.  Multiplanar CT image reconstructions including MIPs were obtained to evaluate the vascular anatomy. Contrast:  75 ml of Omnipaque-300 Comparison:  None. Findings:  There are no pulmonary emboli.  There are no infiltrates or effusions or mass lesions.  Heart size and vascularity are normal.  No adenopathy.  The visualized portion of the upper abdomen is normal. Review of the MIP images confirms the above findings.  IMPRESSION: Normal exam.  Specifically, no evidence of pulmonary emboli or other acute abnormalities. Original Report Authenticated By: Larey Seat, M.D.   Assessment & Plan:   Troy Beck was seen today for annual exam.  Diagnoses and all orders for this visit:  Travel advice encounter  Other orders -     doxazosin (CARDURA) 4 MG tablet; Take 1 tablet (4 mg total) by mouth at bedtime. -     tadalafil (CIALIS) 5 MG tablet; Take 1 tablet (5 mg total) by mouth daily as needed for erectile dysfunction. -     amLODipine (NORVASC) 10 MG tablet; Take 1 tablet (10 mg total) by mouth daily. -     albuterol (PROVENTIL HFA;VENTOLIN HFA) 108 (90 Base) MCG/ACT inhaler; Inhale 2 puffs into the lungs every 6 (six) hours as needed for wheezing or shortness of breath. -     atorvastatin (LIPITOR) 20 MG tablet; Take 1 tablet (20 mg total) by mouth daily. -     atovaquone-proguanil (MALARONE) 250-100 MG  TABS tablet; Take 1 tablet by mouth daily. -     losartan-hydrochlorothiazide (HYZAAR) 100-25 MG tablet; Take 1 tablet by mouth daily. -     fluticasone furoate-vilanterol (BREO ELLIPTA) 100-25 MCG/INH AEPB; Inhale 1 puff into the lungs daily. -     metFORMIN (GLUCOPHAGE) 500 MG tablet; TAKE 1 TABLET BY MOUTH TWICE A DAY WITH A MEAL -     naproxen (NAPROSYN) 500 MG tablet; Take 1 tablet (500 mg total) by mouth 2 (two) times daily with a meal. -     zolpidem (AMBIEN) 10 MG tablet; Take 0.5-1 tablets (5-10 mg total) by mouth at bedtime as needed for sleep.   I have changed Mr. Sunderland's doxazosin, atorvastatin, atovaquone-proguanil, and fluticasone furoate-vilanterol. I am also having him maintain his mometasone, glucose blood, ONETOUCH DELICA LANCETS FINE, tadalafil, amLODipine, albuterol, losartan-hydrochlorothiazide, metFORMIN, naproxen, and zolpidem.  Meds ordered this encounter  Medications  . doxazosin (CARDURA) 4 MG tablet    Sig: Take 1 tablet (4 mg total) by mouth at bedtime.    Dispense:  30  tablet    Refill:  11  . tadalafil (CIALIS) 5 MG tablet    Sig: Take 1 tablet (5 mg total) by mouth daily as needed for erectile dysfunction.    Dispense:  30 tablet    Refill:  3  . amLODipine (NORVASC) 10 MG tablet    Sig: Take 1 tablet (10 mg total) by mouth daily.    Dispense:  30 tablet    Refill:  11  . albuterol (PROVENTIL HFA;VENTOLIN HFA) 108 (90 Base) MCG/ACT inhaler    Sig: Inhale 2 puffs into the lungs every 6 (six) hours as needed for wheezing or shortness of breath.    Dispense:  1 Inhaler    Refill:  11  . atorvastatin (LIPITOR) 20 MG tablet    Sig: Take 1 tablet (20 mg total) by mouth daily.    Dispense:  30 tablet    Refill:  11  . atovaquone-proguanil (MALARONE) 250-100 MG TABS tablet    Sig: Take 1 tablet by mouth daily.    Dispense:  90 tablet    Refill:  1  . losartan-hydrochlorothiazide (HYZAAR) 100-25 MG tablet    Sig: Take 1 tablet by mouth daily.    Dispense:  30 tablet    Refill:  11  . fluticasone furoate-vilanterol (BREO ELLIPTA) 100-25 MCG/INH AEPB    Sig: Inhale 1 puff into the lungs daily.    Dispense:  1 each    Refill:  5  . metFORMIN (GLUCOPHAGE) 500 MG tablet    Sig: TAKE 1 TABLET BY MOUTH TWICE A DAY WITH A MEAL    Dispense:  30 tablet    Refill:  11  . naproxen (NAPROSYN) 500 MG tablet    Sig: Take 1 tablet (500 mg total) by mouth 2 (two) times daily with a meal.    Dispense:  60 tablet    Refill:  3  . zolpidem (AMBIEN) 10 MG tablet    Sig: Take 0.5-1 tablets (5-10 mg total) by mouth at bedtime as needed for sleep.    Dispense:  90 tablet    Refill:  1     Follow-up: Return for a follow-up visit.  Walker Kehr, MD

## 2016-01-13 NOTE — Assessment & Plan Note (Signed)
Hyzaar, Cardura, Norvasc 

## 2016-01-14 NOTE — Addendum Note (Signed)
Addended by: Cresenciano Lick on: 01/14/2016 12:06 PM   Modules accepted: Orders, SmartSet

## 2016-07-01 ENCOUNTER — Other Ambulatory Visit: Payer: Self-pay | Admitting: Internal Medicine

## 2016-07-02 NOTE — Telephone Encounter (Signed)
Done

## 2016-07-08 ENCOUNTER — Other Ambulatory Visit: Payer: Self-pay | Admitting: Internal Medicine

## 2016-07-10 ENCOUNTER — Other Ambulatory Visit: Payer: Self-pay | Admitting: Internal Medicine

## 2016-07-18 ENCOUNTER — Other Ambulatory Visit: Payer: Self-pay | Admitting: Internal Medicine

## 2016-08-11 DIAGNOSIS — H401123 Primary open-angle glaucoma, left eye, severe stage: Secondary | ICD-10-CM | POA: Diagnosis not present

## 2016-08-11 DIAGNOSIS — H401112 Primary open-angle glaucoma, right eye, moderate stage: Secondary | ICD-10-CM | POA: Diagnosis not present

## 2016-08-11 DIAGNOSIS — H2513 Age-related nuclear cataract, bilateral: Secondary | ICD-10-CM | POA: Diagnosis not present

## 2016-08-28 ENCOUNTER — Other Ambulatory Visit: Payer: Self-pay | Admitting: Internal Medicine

## 2016-09-22 DIAGNOSIS — H401123 Primary open-angle glaucoma, left eye, severe stage: Secondary | ICD-10-CM | POA: Diagnosis not present

## 2016-09-22 DIAGNOSIS — H338 Other retinal detachments: Secondary | ICD-10-CM | POA: Diagnosis not present

## 2016-09-22 DIAGNOSIS — H401112 Primary open-angle glaucoma, right eye, moderate stage: Secondary | ICD-10-CM | POA: Diagnosis not present

## 2016-09-22 DIAGNOSIS — H2513 Age-related nuclear cataract, bilateral: Secondary | ICD-10-CM | POA: Diagnosis not present

## 2016-11-10 DIAGNOSIS — H401123 Primary open-angle glaucoma, left eye, severe stage: Secondary | ICD-10-CM | POA: Diagnosis not present

## 2016-11-10 DIAGNOSIS — H338 Other retinal detachments: Secondary | ICD-10-CM | POA: Diagnosis not present

## 2016-11-10 DIAGNOSIS — H401112 Primary open-angle glaucoma, right eye, moderate stage: Secondary | ICD-10-CM | POA: Diagnosis not present

## 2016-11-10 DIAGNOSIS — H2513 Age-related nuclear cataract, bilateral: Secondary | ICD-10-CM | POA: Diagnosis not present

## 2017-01-13 ENCOUNTER — Other Ambulatory Visit: Payer: Self-pay | Admitting: Internal Medicine

## 2017-01-14 ENCOUNTER — Other Ambulatory Visit: Payer: Self-pay | Admitting: Internal Medicine

## 2017-01-14 ENCOUNTER — Telehealth: Payer: Self-pay | Admitting: Internal Medicine

## 2017-01-14 NOTE — Telephone Encounter (Signed)
Pt made an appt, would like to have enough med to make it to 7/17 for his cpe ?

## 2017-01-15 ENCOUNTER — Other Ambulatory Visit: Payer: Self-pay | Admitting: *Deleted

## 2017-01-15 MED ORDER — LOSARTAN POTASSIUM-HCTZ 100-25 MG PO TABS: 1.0000 | ORAL_TABLET | Freq: Every day | ORAL | 0 refills | Status: DC

## 2017-01-15 MED ORDER — ATORVASTATIN CALCIUM 20 MG PO TABS: 20.0000 mg | ORAL_TABLET | Freq: Every day | ORAL | 0 refills | Status: DC

## 2017-01-15 MED ORDER — AMLODIPINE BESYLATE 10 MG PO TABS: 10.0000 mg | ORAL_TABLET | Freq: Every day | ORAL | 0 refills | Status: DC

## 2017-01-15 MED ORDER — DOXAZOSIN MESYLATE 4 MG PO TABS: 4.0000 mg | ORAL_TABLET | Freq: Every day | ORAL | 0 refills | Status: DC

## 2017-01-15 NOTE — Telephone Encounter (Signed)
Sent 30 day script on maintenance meds until appt...Johny Chess

## 2017-02-02 ENCOUNTER — Encounter: Payer: Self-pay | Admitting: Internal Medicine

## 2017-02-12 ENCOUNTER — Encounter: Payer: Self-pay | Admitting: Internal Medicine

## 2017-02-15 ENCOUNTER — Other Ambulatory Visit: Payer: Self-pay | Admitting: Internal Medicine

## 2017-02-15 MED ORDER — DOXAZOSIN MESYLATE 4 MG PO TABS: 4.0000 mg | ORAL_TABLET | Freq: Every day | ORAL | 0 refills | Status: DC

## 2017-02-15 MED ORDER — AMLODIPINE BESYLATE 10 MG PO TABS: 10.0000 mg | ORAL_TABLET | Freq: Every day | ORAL | 0 refills | Status: DC

## 2017-02-15 MED ORDER — METFORMIN HCL 500 MG PO TABS: 500.0000 mg | ORAL_TABLET | Freq: Two times a day (BID) | ORAL | 0 refills | Status: DC

## 2017-02-15 MED ORDER — LOSARTAN POTASSIUM-HCTZ 100-25 MG PO TABS: 1.0000 | ORAL_TABLET | Freq: Every day | ORAL | 0 refills | Status: DC

## 2017-02-15 MED ORDER — ZOLPIDEM TARTRATE 10 MG PO TABS: ORAL_TABLET | ORAL | 0 refills | Status: DC

## 2017-02-15 MED ORDER — ATORVASTATIN CALCIUM 20 MG PO TABS: 20.0000 mg | ORAL_TABLET | Freq: Every day | ORAL | 0 refills | Status: DC

## 2017-02-15 NOTE — Telephone Encounter (Signed)
All prescriptions sent in besides Ambien, please advise in Dr. Enis Slipper absence.

## 2017-02-15 NOTE — Telephone Encounter (Signed)
losartan-hydrochlorothiazide (HYZAAR) 100-25 MG tablet   amLODipine (NORVASC) 10 MG tablet   metFORMIN (GLUCOPHAGE) 500 MG tablet   atorvastatin (LIPITOR) 20 MG tablet   doxazosin (CARDURA) 4 MG tablet   zolpidem (AMBIEN) 10 MG tablet   Patient had to reschudle his appointment, due to Dr. Alain Marion being out of office. Can these medication be refilled. His next appointment is August 14.

## 2017-03-02 ENCOUNTER — Ambulatory Visit (INDEPENDENT_AMBULATORY_CARE_PROVIDER_SITE_OTHER): Payer: Self-pay | Admitting: Internal Medicine

## 2017-03-02 ENCOUNTER — Encounter: Payer: Self-pay | Admitting: Internal Medicine

## 2017-03-02 VITALS — BP 108/64 | HR 65 | Temp 99.1°F | Ht 68.0 in | Wt 192.0 lb

## 2017-03-02 DIAGNOSIS — I10 Essential (primary) hypertension: Secondary | ICD-10-CM

## 2017-03-02 DIAGNOSIS — N5237 Erectile dysfunction following prostate ablative therapy: Secondary | ICD-10-CM

## 2017-03-02 DIAGNOSIS — J4520 Mild intermittent asthma, uncomplicated: Secondary | ICD-10-CM

## 2017-03-02 DIAGNOSIS — Z0000 Encounter for general adult medical examination without abnormal findings: Secondary | ICD-10-CM

## 2017-03-02 DIAGNOSIS — Z7184 Encounter for health counseling related to travel: Secondary | ICD-10-CM

## 2017-03-02 DIAGNOSIS — E119 Type 2 diabetes mellitus without complications: Secondary | ICD-10-CM

## 2017-03-02 MED ORDER — FLUTICASONE FUROATE-VILANTEROL 100-25 MCG/INH IN AEPB: 1.0000 | INHALATION_SPRAY | Freq: Every day | RESPIRATORY_TRACT | 3 refills | Status: DC

## 2017-03-02 MED ORDER — AMLODIPINE BESYLATE 10 MG PO TABS: 10.0000 mg | ORAL_TABLET | Freq: Every day | ORAL | 3 refills | Status: DC

## 2017-03-02 MED ORDER — ALBUTEROL SULFATE HFA 108 (90 BASE) MCG/ACT IN AERS: 2.0000 | INHALATION_SPRAY | Freq: Four times a day (QID) | RESPIRATORY_TRACT | 3 refills | Status: DC | PRN

## 2017-03-02 MED ORDER — TADALAFIL 5 MG PO TABS: 5.0000 mg | ORAL_TABLET | Freq: Every day | ORAL | 3 refills | Status: DC | PRN

## 2017-03-02 MED ORDER — ATORVASTATIN CALCIUM 20 MG PO TABS: 20.0000 mg | ORAL_TABLET | Freq: Every day | ORAL | 3 refills | Status: DC

## 2017-03-02 MED ORDER — ZOLPIDEM TARTRATE 10 MG PO TABS: ORAL_TABLET | ORAL | 1 refills | Status: DC

## 2017-03-02 MED ORDER — METFORMIN HCL 500 MG PO TABS: 500.0000 mg | ORAL_TABLET | Freq: Two times a day (BID) | ORAL | 3 refills | Status: DC

## 2017-03-02 MED ORDER — DOXAZOSIN MESYLATE 4 MG PO TABS: 4.0000 mg | ORAL_TABLET | Freq: Every day | ORAL | 3 refills | Status: DC

## 2017-03-02 MED ORDER — LOSARTAN POTASSIUM-HCTZ 100-25 MG PO TABS: 1.0000 | ORAL_TABLET | Freq: Every day | ORAL | 3 refills | Status: DC

## 2017-03-02 NOTE — Assessment & Plan Note (Signed)
Metformin 

## 2017-03-02 NOTE — Assessment & Plan Note (Signed)
We discussed age appropriate health related issues, including available/recomended screening tests and vaccinations. We discussed a need for adhering to healthy diet and exercise. Labs/EKG were reviewed/ordered. All questions were answered.   

## 2017-03-02 NOTE — Assessment & Plan Note (Signed)
Hyzaar, Cardura, Norvasc

## 2017-03-02 NOTE — Assessment & Plan Note (Signed)
Frequent travel to [place] and [place]

## 2017-03-02 NOTE — Progress Notes (Signed)
Subjective:  Patient ID: Troy Beck, male    DOB: 22-Mar-1956  Age: 61 y.o. MRN: [id number]  CC: No chief complaint on file.   HPI BRANT PEETS presents for a well exam f/u. Playing table tennis a lot  Outpatient Medications Prior to Visit  Medication Sig Dispense Refill  . albuterol (PROVENTIL HFA;VENTOLIN HFA) 108 (90 Base) MCG/ACT inhaler Inhale 2 puffs into the lungs every 6 (six) hours as needed for wheezing or shortness of breath. 1 Inhaler 11  . amLODipine (NORVASC) 10 MG tablet Take 1 tablet (10 mg total) by mouth daily. Must keep 03/02/17 appt for future refills 30 tablet 0  . atorvastatin (LIPITOR) 20 MG tablet Take 1 tablet (20 mg total) by mouth daily. Must keep 03/02/17 appt for future refills 30 tablet 0  . atovaquone-proguanil (MALARONE) 250-100 MG TABS tablet Take 1 tablet by mouth daily. 90 tablet 1  . doxazosin (CARDURA) 4 MG tablet Take 1 tablet (4 mg total) by mouth at bedtime. Must keep 03/02/17 appt for future refills 30 tablet 0  . fluticasone furoate-vilanterol (BREO ELLIPTA) 100-25 MCG/INH AEPB Inhale 1 puff into the lungs daily. annual appt w/labs is due must see MD for refills 60 each 0  . glucose blood (ONETOUCH VERIO) test strip Use as instructed 100 each 11  . losartan-hydrochlorothiazide (HYZAAR) 100-25 MG tablet Take 1 tablet by mouth daily. Must keep 03/02/17 appt for future refills 30 tablet 0  . metFORMIN (GLUCOPHAGE) 500 MG tablet Take 1 tablet (500 mg total) by mouth 2 (two) times daily with a meal. Must keep 03/02/17 appt for future refills 60 tablet 0  . mometasone (NASONEX) 50 MCG/ACT nasal spray Place 2 sprays into the nose daily.    . naproxen (NAPROSYN) 500 MG tablet Take 1 tablet (500 mg total) by mouth 2 (two) times daily with a meal. 60 tablet 3  . ONETOUCH DELICA LANCETS FINE MISC 1 Device by Does not apply route daily as needed. 100 each 3  . tadalafil (CIALIS) 5 MG tablet Take 1 tablet (5 mg total) by mouth daily as needed for erectile  dysfunction. 30 tablet 3  . zolpidem (AMBIEN) 10 MG tablet TAKE ONE-HALF OR ONE TABLET BY MOUTH AT BEDTIME AS NEEDED. Must keep 03/02/17 appt for future refills 30 tablet 0   No facility-administered medications prior to visit.     ROS Review of Systems  Constitutional: Negative for appetite change, fatigue and unexpected weight change.  HENT: Negative for congestion, nosebleeds, sneezing, sore throat and trouble swallowing.   Eyes: Negative for itching and visual disturbance.  Respiratory: Negative for cough.   Cardiovascular: Negative for chest pain, palpitations and leg swelling.  Gastrointestinal: Negative for abdominal distention, blood in stool, diarrhea and nausea.  Genitourinary: Negative for frequency and hematuria.  Musculoskeletal: Negative for back pain, gait problem, joint swelling and neck pain.  Skin: Negative for rash.  Neurological: Negative for dizziness, tremors, speech difficulty and weakness.  Psychiatric/Behavioral: Negative for agitation, dysphoric mood, sleep disturbance and suicidal ideas. The patient is not nervous/anxious.     Objective:  BP 108/64 (BP Location: Left Arm, Patient Position: Sitting, Cuff Size: Large)   Pulse 65   Temp 99.1 F (37.3 C) (Oral)   Ht 5\' 8"  (1.727 m)   Wt 192 lb (87.1 kg)   SpO2 99%   BMI 29.19 kg/m   BP Readings from Last 3 Encounters:  03/02/17 108/64  01/13/16 100/60  07/24/14 (!) 170/118    Wt Readings  from Last 3 Encounters:  03/02/17 192 lb (87.1 kg)  01/13/16 195 lb (88.5 kg)  07/24/14 195 lb (88.5 kg)    Physical Exam   Procedure: EKG Indication: well exam, dyslipidemia Impression: NSR. No acute changes.  Lab Results  Component Value Date   WBC 5.6 09/05/2012   HGB 14.0 09/05/2012   HCT 42.1 09/05/2012   PLT 206.0 09/05/2012   GLUCOSE 122 (H) 09/05/2012   CHOL 110 09/05/2012   TRIG 52.0 09/05/2012   HDL 34.40 (L) 09/05/2012   LDLCALC 65 09/05/2012   ALT 22 09/05/2012   AST 24 09/05/2012   NA  139 09/05/2012   K 4.2 09/05/2012   CL 103 09/05/2012   CREATININE 1.3 09/05/2012   BUN 18 09/05/2012   CO2 30 09/05/2012   TSH 0.92 09/05/2012   PSA 1.20 09/05/2012   HGBA1C 7.0 (H) 09/05/2012   MICROALBUR 223.3 (H) 12/05/2009    Ct Angio Chest W/cm &/or Wo Cm  Result Date: 08/25/2010 *RADIOLOGY REPORT* Clinical Data:  Chest pain and shortness of breath.  Productive cough. CT ANGIOGRAPHY CHEST WITH CONTRAST Technique:  Multidetector CT imaging of the chest was performed using the standard protocol during bolus administration of intravenous contrast.  Multiplanar CT image reconstructions including MIPs were obtained to evaluate the vascular anatomy. Contrast:  75 ml of Omnipaque-300 Comparison:  None. Findings:  There are no pulmonary emboli.  There are no infiltrates or effusions or mass lesions.  Heart size and vascularity are normal.  No adenopathy.  The visualized portion of the upper abdomen is normal. Review of the MIP images confirms the above findings. IMPRESSION: Normal exam.  Specifically, no evidence of pulmonary emboli or other acute abnormalities. Original Report Authenticated By: Larey Seat, M.D.   Assessment & Plan:   There are no diagnoses linked to this encounter. I am having Mr. Steinkamp maintain his mometasone, glucose blood, ONETOUCH DELICA LANCETS FINE, tadalafil, albuterol, atovaquone-proguanil, naproxen, fluticasone furoate-vilanterol, amLODipine, atorvastatin, doxazosin, losartan-hydrochlorothiazide, metFORMIN, and zolpidem.  No orders of the defined types were placed in this encounter.    Follow-up: No Follow-up on file.  Walker Kehr, MD

## 2017-03-16 ENCOUNTER — Other Ambulatory Visit: Payer: Self-pay | Admitting: Internal Medicine

## 2017-04-06 ENCOUNTER — Other Ambulatory Visit: Payer: Self-pay | Admitting: Internal Medicine

## 2017-04-07 NOTE — Telephone Encounter (Signed)
Called refill [facility] had to leave on pharmacy vm...Troy Beck

## 2017-08-05 ENCOUNTER — Other Ambulatory Visit: Payer: Self-pay | Admitting: Internal Medicine

## 2017-09-02 ENCOUNTER — Ambulatory Visit: Payer: Self-pay | Admitting: Internal Medicine

## 2017-09-13 ENCOUNTER — Other Ambulatory Visit (INDEPENDENT_AMBULATORY_CARE_PROVIDER_SITE_OTHER): Payer: BLUE CROSS/BLUE SHIELD

## 2017-09-13 DIAGNOSIS — I10 Essential (primary) hypertension: Secondary | ICD-10-CM

## 2017-09-13 DIAGNOSIS — Z7184 Encounter for health counseling related to travel: Secondary | ICD-10-CM

## 2017-09-13 DIAGNOSIS — Z0000 Encounter for general adult medical examination without abnormal findings: Secondary | ICD-10-CM

## 2017-09-13 DIAGNOSIS — E119 Type 2 diabetes mellitus without complications: Secondary | ICD-10-CM | POA: Diagnosis not present

## 2017-09-13 LAB — LIPID PANEL
CHOL/HDL RATIO: 3
Cholesterol: 133 mg/dL (ref 0–200)
HDL: 41.8 mg/dL (ref >39.00)
LDL Cholesterol: 76 mg/dL (ref 0–99)
NONHDL: 91.59
Triglycerides: 79 mg/dL (ref 0.0–149.0)
VLDL: 15.8 mg/dL (ref 0.0–40.0)

## 2017-09-13 LAB — CBC WITH DIFFERENTIAL/PLATELET
BASOS PCT: 1.7 % (ref 0.0–3.0)
Basophils Absolute: 0.1 10*3/uL (ref 0.0–0.1)
Eosinophils Absolute: 0.3 10*3/uL (ref 0.0–0.7)
Eosinophils Relative: 5.9 % — ABNORMAL HIGH (ref 0.0–5.0)
HEMATOCRIT: 42.2 % (ref 39.0–52.0)
Hemoglobin: 14.6 g/dL (ref 13.0–17.0)
LYMPHS PCT: 53 % — AB (ref 12.0–46.0)
Lymphs Abs: 2.8 10*3/uL (ref 0.7–4.0)
MCHC: 34.6 g/dL (ref 30.0–36.0)
MCV: 84.9 fl (ref 78.0–100.0)
MONOS PCT: 8.8 % (ref 3.0–12.0)
Monocytes Absolute: 0.5 10*3/uL (ref 0.1–1.0)
NEUTROS ABS: 1.6 10*3/uL (ref 1.4–7.7)
Neutrophils Relative %: 30.6 % — ABNORMAL LOW (ref 43.0–77.0)
Platelets: 202 10*3/uL (ref 150.0–400.0)
RBC: 4.97 Mil/uL (ref 4.22–5.81)
RDW: 13.9 % (ref 11.5–15.5)
WBC: 5.3 10*3/uL (ref 4.0–10.5)

## 2017-09-13 LAB — URINALYSIS, ROUTINE W REFLEX MICROSCOPIC
Bilirubin Urine: NEGATIVE
Hgb urine dipstick: NEGATIVE
Ketones, ur: NEGATIVE
Leukocytes, UA: NEGATIVE
Nitrite: NEGATIVE
Specific Gravity, Urine: 1.02 (ref 1.000–1.030)
TOTAL PROTEIN, URINE-UPE24: 100 — AB
Urine Glucose: NEGATIVE
Urobilinogen, UA: 0.2 (ref 0.0–1.0)
pH: 6.5 (ref 5.0–8.0)

## 2017-09-13 LAB — HEPATIC FUNCTION PANEL
ALK PHOS: 51 U/L (ref 39–117)
ALT: 13 U/L (ref 0–53)
AST: 16 U/L (ref 0–37)
Albumin: 4.5 g/dL (ref 3.5–5.2)
BILIRUBIN DIRECT: 0.2 mg/dL (ref 0.0–0.3)
Total Bilirubin: 0.6 mg/dL (ref 0.2–1.2)
Total Protein: 7.7 g/dL (ref 6.0–8.3)

## 2017-09-13 LAB — BASIC METABOLIC PANEL
BUN: 20 mg/dL (ref 6–23)
CHLORIDE: 102 meq/L (ref 96–112)
CO2: 26 mEq/L (ref 19–32)
CREATININE: 1.78 mg/dL — AB (ref 0.40–1.50)
Calcium: 10.4 mg/dL (ref 8.4–10.5)
GFR: 49.88 mL/min — AB (ref >60.00)
Glucose, Bld: 132 mg/dL — ABNORMAL HIGH (ref 70–99)
Potassium: 3.6 mEq/L (ref 3.5–5.1)
Sodium: 142 mEq/L (ref 135–145)

## 2017-09-13 LAB — HEMOGLOBIN A1C: Hgb A1c MFr Bld: 8 % — ABNORMAL HIGH (ref 4.6–6.5)

## 2017-09-13 LAB — VITAMIN D 25 HYDROXY (VIT D DEFICIENCY, FRACTURES): VITD: 27.49 ng/mL — AB (ref 30.00–100.00)

## 2017-09-13 LAB — TSH: TSH: 1.22 u[IU]/mL (ref 0.35–4.50)

## 2017-09-13 LAB — VITAMIN B12: Vitamin B-12: 452 pg/mL (ref 211–911)

## 2017-09-13 LAB — PSA: PSA: 1.99 ng/mL (ref 0.10–4.00)

## 2017-09-14 ENCOUNTER — Encounter: Payer: Self-pay | Admitting: Internal Medicine

## 2017-09-14 ENCOUNTER — Ambulatory Visit: Payer: BLUE CROSS/BLUE SHIELD | Admitting: Internal Medicine

## 2017-09-14 DIAGNOSIS — E785 Hyperlipidemia, unspecified: Secondary | ICD-10-CM | POA: Diagnosis not present

## 2017-09-14 DIAGNOSIS — G4700 Insomnia, unspecified: Secondary | ICD-10-CM

## 2017-09-14 DIAGNOSIS — J4520 Mild intermittent asthma, uncomplicated: Secondary | ICD-10-CM | POA: Diagnosis not present

## 2017-09-14 DIAGNOSIS — E1121 Type 2 diabetes mellitus with diabetic nephropathy: Secondary | ICD-10-CM | POA: Diagnosis not present

## 2017-09-14 DIAGNOSIS — E119 Type 2 diabetes mellitus without complications: Secondary | ICD-10-CM | POA: Diagnosis not present

## 2017-09-14 LAB — HEPATITIS C ANTIBODY
Hepatitis C Ab: NONREACTIVE
SIGNAL TO CUT-OFF: 0.01 (ref <1.00)

## 2017-09-14 MED ORDER — LOSARTAN POTASSIUM-HCTZ 100-25 MG PO TABS: 0.5000 | ORAL_TABLET | Freq: Every day | ORAL | 3 refills | Status: DC

## 2017-09-14 MED ORDER — REPAGLINIDE 1 MG PO TABS: 1.0000 mg | ORAL_TABLET | Freq: Three times a day (TID) | ORAL | 3 refills | Status: DC

## 2017-09-14 MED ORDER — VITAMIN D3 1.25 MG (50000 UT) PO CAPS: 1.0000 | ORAL_CAPSULE | ORAL | 0 refills | Status: DC

## 2017-09-14 MED ORDER — VITAMIN D3 50 MCG (2000 UT) PO CAPS: 2000.0000 [IU] | ORAL_CAPSULE | Freq: Every day | ORAL | 3 refills | Status: AC

## 2017-09-14 NOTE — Assessment & Plan Note (Addendum)
St 3a reduce Losartan HCT

## 2017-09-14 NOTE — Assessment & Plan Note (Signed)
Lipitor 

## 2017-09-14 NOTE — Progress Notes (Signed)
Subjective:  Patient ID: Troy Beck, male    DOB: 29-Apr-1956  Age: 62 y.o. MRN: [id number]  CC: No chief complaint on file.   HPI HADRIAN YARBROUGH presents for HTN, DM, asthma Working 8 pm-4 am due to [place] time difference 7 days/week  Outpatient Medications Prior to Visit  Medication Sig Dispense Refill  . albuterol (PROVENTIL HFA;VENTOLIN HFA) 108 (90 Base) MCG/ACT inhaler Inhale 2 puffs into the lungs every 6 (six) hours as needed for wheezing or shortness of breath. 3 Inhaler 3  . amLODipine (NORVASC) 10 MG tablet Take 1 tablet (10 mg total) by mouth daily. 90 tablet 3  . atorvastatin (LIPITOR) 20 MG tablet Take 1 tablet (20 mg total) by mouth daily. 90 tablet 3  . atovaquone-proguanil (MALARONE) 250-100 MG TABS tablet Take 1 tablet by mouth daily. 90 tablet 1  . doxazosin (CARDURA) 4 MG tablet Take 1 tablet (4 mg total) by mouth at bedtime. 90 tablet 3  . doxazosin (CARDURA) 4 MG tablet TAKE 1 TABLET AT BEDTIME 90 tablet 3  . fluticasone furoate-vilanterol (BREO ELLIPTA) 100-25 MCG/INH AEPB Inhale 1 puff into the lungs daily. 3 each 3  . glucose blood (ONETOUCH VERIO) test strip Use as instructed 100 each 11  . losartan-hydrochlorothiazide (HYZAAR) 100-25 MG tablet Take 1 tablet by mouth daily. 90 tablet 3  . metFORMIN (GLUCOPHAGE) 500 MG tablet Take 1 tablet (500 mg total) by mouth 2 (two) times daily with a meal. 180 tablet 3  . mometasone (NASONEX) 50 MCG/ACT nasal spray Place 2 sprays into the nose daily.    . naproxen (NAPROSYN) 500 MG tablet Take 1 tablet (500 mg total) by mouth 2 (two) times daily with a meal. 60 tablet 3  . ONETOUCH DELICA LANCETS FINE MISC 1 Device by Does not apply route daily as needed. 100 each 3  . tadalafil (CIALIS) 5 MG tablet Take 1 tablet (5 mg total) by mouth daily as needed for erectile dysfunction. 30 tablet 3  . zolpidem (AMBIEN) 10 MG tablet TAKE ONE-HALF TO ONE TABLET BY MOUTH AT BEDTIME AS NEEDED 90 tablet 1   No facility-administered  medications prior to visit.     ROS Review of Systems  Constitutional: Negative for appetite change, fatigue and unexpected weight change.  HENT: Negative for congestion, nosebleeds, sneezing, sore throat and trouble swallowing.   Eyes: Negative for itching and visual disturbance.  Respiratory: Negative for cough.   Cardiovascular: Negative for chest pain, palpitations and leg swelling.  Gastrointestinal: Negative for abdominal distention, blood in stool, diarrhea and nausea.  Genitourinary: Negative for frequency and hematuria.  Musculoskeletal: Negative for back pain, gait problem, joint swelling and neck pain.  Skin: Negative for rash.  Neurological: Negative for dizziness, tremors, speech difficulty and weakness.  Psychiatric/Behavioral: Negative for agitation, dysphoric mood and sleep disturbance. The patient is not nervous/anxious.     Objective:  BP 114/80 (BP Location: Left Arm, Patient Position: Sitting, Cuff Size: Normal)   Pulse 76   Temp 98.1 F (36.7 C) (Oral)   Ht 5\' 8"  (1.727 m)   Wt 191 lb (86.6 kg)   SpO2 99%   BMI 29.04 kg/m   BP Readings from Last 3 Encounters:  09/14/17 114/80  03/02/17 108/64  01/13/16 100/60    Wt Readings from Last 3 Encounters:  09/14/17 191 lb (86.6 kg)  03/02/17 192 lb (87.1 kg)  01/13/16 195 lb (88.5 kg)    Physical Exam  Constitutional: He is oriented to person, place,  and time. He appears well-developed. No distress.  NAD  HENT:  Mouth/Throat: Oropharynx is clear and moist.  Eyes: Conjunctivae are normal. Pupils are equal, round, and reactive to light.  Neck: Normal range of motion. No JVD present. No thyromegaly present.  Cardiovascular: Normal rate, regular rhythm, normal heart sounds and intact distal pulses. Exam reveals no gallop and no friction rub.  No murmur heard. Pulmonary/Chest: Effort normal and breath sounds normal. No respiratory distress. He has no wheezes. He has no rales. He exhibits no tenderness.    Abdominal: Soft. Bowel sounds are normal. He exhibits no distension and no mass. There is no tenderness. There is no rebound and no guarding.  Musculoskeletal: Normal range of motion. He exhibits no edema or tenderness.  Lymphadenopathy:    He has no cervical adenopathy.  Neurological: He is alert and oriented to person, place, and time. He has normal reflexes. No cranial nerve deficit. He exhibits normal muscle tone. He displays a negative Romberg sign. Coordination and gait normal.  Skin: Skin is warm and dry. No rash noted.  Psychiatric: He has a normal mood and affect. His behavior is normal. Judgment and thought content normal.    Lab Results  Component Value Date   WBC 5.3 09/13/2017   HGB 14.6 09/13/2017   HCT 42.2 09/13/2017   PLT 202.0 09/13/2017   GLUCOSE 132 (H) 09/13/2017   CHOL 133 09/13/2017   TRIG 79.0 09/13/2017   HDL 41.80 09/13/2017   LDLCALC 76 09/13/2017   ALT 13 09/13/2017   AST 16 09/13/2017   NA 142 09/13/2017   K 3.6 09/13/2017   CL 102 09/13/2017   CREATININE 1.78 (H) 09/13/2017   BUN 20 09/13/2017   CO2 26 09/13/2017   TSH 1.22 09/13/2017   PSA 1.99 09/13/2017   HGBA1C 8.0 (H) 09/13/2017   MICROALBUR 223.3 (H) 12/05/2009    Ct Angio Chest W/cm &/or Wo Cm  Result Date: 08/25/2010 *RADIOLOGY REPORT* Clinical Data:  Chest pain and shortness of breath.  Productive cough. CT ANGIOGRAPHY CHEST WITH CONTRAST Technique:  Multidetector CT imaging of the chest was performed using the standard protocol during bolus administration of intravenous contrast.  Multiplanar CT image reconstructions including MIPs were obtained to evaluate the vascular anatomy. Contrast:  75 ml of Omnipaque-300 Comparison:  None. Findings:  There are no pulmonary emboli.  There are no infiltrates or effusions or mass lesions.  Heart size and vascularity are normal.  No adenopathy.  The visualized portion of the upper abdomen is normal. Review of the MIP images confirms the above findings.  IMPRESSION: Normal exam.  Specifically, no evidence of pulmonary emboli or other acute abnormalities. Original Report Authenticated By: Larey Seat, M.D.   Assessment & Plan:   There are no diagnoses linked to this encounter. I am having Elisabeth Cara T. Ullery maintain his mometasone, glucose blood, ONETOUCH DELICA LANCETS FINE, atovaquone-proguanil, naproxen, albuterol, amLODipine, atorvastatin, doxazosin, fluticasone furoate-vilanterol, losartan-hydrochlorothiazide, metFORMIN, tadalafil, zolpidem, and doxazosin.  No orders of the defined types were placed in this encounter.    Follow-up: No Follow-up on file.  Walker Kehr, MD

## 2017-09-14 NOTE — Assessment & Plan Note (Signed)
Proventil 

## 2017-09-14 NOTE — Patient Instructions (Signed)
Reduce Losartan HCT to 1/2 tablet a day

## 2017-09-14 NOTE — Assessment & Plan Note (Signed)
On Metformin 

## 2017-09-14 NOTE — Assessment & Plan Note (Signed)
Zolpidem

## 2017-10-08 ENCOUNTER — Other Ambulatory Visit: Payer: Self-pay | Admitting: *Deleted

## 2017-10-08 ENCOUNTER — Telehealth: Payer: Self-pay | Admitting: Internal Medicine

## 2017-10-08 MED ORDER — LOSARTAN POTASSIUM-HCTZ 100-25 MG PO TABS: 0.5000 | ORAL_TABLET | Freq: Every day | ORAL | 3 refills | Status: DC

## 2017-10-08 NOTE — Telephone Encounter (Signed)
Refill did not go thru to the pharmacy on 09/14/17 because it was no print on the prescription. Will resend.

## 2017-10-08 NOTE — Telephone Encounter (Signed)
Copied from [place] [phone]. Topic: Quick Communication - Rx Refill/Question >> Oct 08, 2017  3:13 PM Synthia Innocent wrote: Medication: losartan-hydrochlorothiazide (HYZAAR) 100-25 MG tablet, [facility] Has the patient contacted their pharmacy? Yes.   (Agent: If no, request that the patient contact the pharmacy for the refill.) Preferred Pharmacy (with phone number or street name): [facility] [place], Agent: Please be advised that RX refills may take up to 3 business days. We ask that you follow-up with your pharmacy.

## 2017-10-23 ENCOUNTER — Other Ambulatory Visit: Payer: Self-pay | Admitting: Internal Medicine

## 2017-11-29 ENCOUNTER — Telehealth: Payer: Self-pay | Admitting: Internal Medicine

## 2017-11-29 NOTE — Telephone Encounter (Signed)
Pt requesting refill on Zolpidem (Ambien) 10mg  tablet, last filled on 04/07/17 #90 with 1 refill.  Pt states he is going to [place] tomorrow.   LOV: 09/14/17 Dr. Alain Marion  [facility]  [place]

## 2017-11-29 NOTE — Telephone Encounter (Signed)
Copied from [place] [id number]. Topic: Quick Communication - Rx Refill/Question >> Nov 29, 2017 10:34 AM Yvette Rack wrote: Medication: zolpidem (AMBIEN) 10 MG tablet   Pt is going to [place] tomorrow Has the patient contacted their pharmacy? Yes.   (Agent: If no, request that the patient contact the pharmacy for the refill.) Preferred Pharmacy (with phone number or street name): [id number] [place], [place] - [place] [phone] (Phone) [phone] (Fax)     Agent: Please be advised that RX refills may take up to 3 business days. We ask that you follow-up with your pharmacy.

## 2017-12-01 ENCOUNTER — Telehealth: Payer: Self-pay

## 2017-12-01 NOTE — Telephone Encounter (Signed)
See other TE.

## 2017-12-01 NOTE — Telephone Encounter (Signed)
Recd CRM where patient was requesting refill on Ambien, CRM recd 5/14----our office requires up to 48 hours to honor refill requests---if patient has already left country, we can work on refill after he returns----left message asking patient to call back if he still needs refill and is still in the country

## 2017-12-07 ENCOUNTER — Other Ambulatory Visit: Payer: Self-pay | Admitting: Internal Medicine

## 2017-12-08 ENCOUNTER — Ambulatory Visit: Payer: BLUE CROSS/BLUE SHIELD | Admitting: Internal Medicine

## 2017-12-15 ENCOUNTER — Ambulatory Visit: Payer: BLUE CROSS/BLUE SHIELD | Admitting: Internal Medicine

## 2017-12-23 ENCOUNTER — Other Ambulatory Visit: Payer: Self-pay

## 2017-12-27 MED ORDER — ZOLPIDEM TARTRATE 10 MG PO TABS: 5.0000 mg | ORAL_TABLET | Freq: Every evening | ORAL | 1 refills | Status: DC | PRN

## 2017-12-29 DIAGNOSIS — E119 Type 2 diabetes mellitus without complications: Secondary | ICD-10-CM | POA: Diagnosis not present

## 2017-12-29 DIAGNOSIS — H338 Other retinal detachments: Secondary | ICD-10-CM | POA: Diagnosis not present

## 2017-12-29 DIAGNOSIS — H2513 Age-related nuclear cataract, bilateral: Secondary | ICD-10-CM | POA: Diagnosis not present

## 2017-12-29 DIAGNOSIS — H401112 Primary open-angle glaucoma, right eye, moderate stage: Secondary | ICD-10-CM | POA: Diagnosis not present

## 2017-12-29 DIAGNOSIS — H401123 Primary open-angle glaucoma, left eye, severe stage: Secondary | ICD-10-CM | POA: Diagnosis not present

## 2017-12-29 LAB — HM DIABETES EYE EXAM

## 2018-01-13 ENCOUNTER — Encounter: Payer: Self-pay | Admitting: Internal Medicine

## 2018-01-28 ENCOUNTER — Other Ambulatory Visit: Payer: Self-pay | Admitting: Internal Medicine

## 2018-03-16 ENCOUNTER — Other Ambulatory Visit: Payer: Self-pay | Admitting: Internal Medicine

## 2018-04-03 ENCOUNTER — Other Ambulatory Visit: Payer: Self-pay | Admitting: Internal Medicine

## 2018-04-06 ENCOUNTER — Other Ambulatory Visit: Payer: Self-pay | Admitting: Internal Medicine

## 2018-04-11 ENCOUNTER — Other Ambulatory Visit: Payer: Self-pay | Admitting: Internal Medicine

## 2018-04-11 NOTE — Telephone Encounter (Signed)
Copied from [place] [id number]. Topic: Quick Communication - Rx Refill/Question >> Apr 11, 2018  4:14 PM Reyne Dumas L wrote: Medication: tadalafil (CIALIS) 5 MG tablet  Has the patient contacted their pharmacy? Yes - pharmacy says they do not have a script (Agent: If no, request that the patient contact the pharmacy for the refill.) (Agent: If yes, when and what did the pharmacy advise?)  Preferred Pharmacy (with phone number or street name): [id number] [place], [place] [phone] (Phone) [phone] (Fax)  Agent: Please be advised that RX refills may take up to 3 business days. We ask that you follow-up with your pharmacy.

## 2018-04-11 NOTE — Telephone Encounter (Signed)
Tadalafil refill Last refill:03/02/17 #30/3 refill (expired Rx) Last OV:09/14/17 NLZ:[id number] [id number] [place], [place] [phone] (Phone) [phone] (Fax)

## 2018-04-13 ENCOUNTER — Other Ambulatory Visit: Payer: Self-pay | Admitting: Internal Medicine

## 2018-04-13 MED ORDER — TADALAFIL 5 MG PO TABS: 5.0000 mg | ORAL_TABLET | Freq: Every day | ORAL | 3 refills | Status: DC | PRN

## 2018-04-13 NOTE — Telephone Encounter (Signed)
Pt notified of med sent in

## 2018-05-11 DIAGNOSIS — H401123 Primary open-angle glaucoma, left eye, severe stage: Secondary | ICD-10-CM | POA: Diagnosis not present

## 2018-05-11 DIAGNOSIS — H2513 Age-related nuclear cataract, bilateral: Secondary | ICD-10-CM | POA: Diagnosis not present

## 2018-05-11 DIAGNOSIS — E119 Type 2 diabetes mellitus without complications: Secondary | ICD-10-CM | POA: Diagnosis not present

## 2018-05-11 DIAGNOSIS — H401112 Primary open-angle glaucoma, right eye, moderate stage: Secondary | ICD-10-CM | POA: Diagnosis not present

## 2018-05-30 DIAGNOSIS — H401112 Primary open-angle glaucoma, right eye, moderate stage: Secondary | ICD-10-CM | POA: Diagnosis not present

## 2018-05-30 DIAGNOSIS — H401123 Primary open-angle glaucoma, left eye, severe stage: Secondary | ICD-10-CM | POA: Diagnosis not present

## 2018-07-22 ENCOUNTER — Telehealth: Payer: Self-pay | Admitting: Internal Medicine

## 2018-07-22 ENCOUNTER — Other Ambulatory Visit: Payer: Self-pay | Admitting: Internal Medicine

## 2018-07-22 NOTE — Telephone Encounter (Signed)
Copied from [place] [id number]. Topic: Quick Communication - Rx Refill/Question >> Jul 22, 2018  5:13 PM Keene Breath wrote: Medication: repaglinide (PRANDIN) 1 MG tablet  Patient called to request a refill for the above medication.  He stated that he is out of town at the moment and does not have enough medication until he gets back.  Agent called the office, but they were closed.  Informed patient, and he stated that he still wanted agent to send the request to be filled.  Preferred Pharmacy (with phone number or street name): [id number] [place], [place] [phone] (Phone) [phone] (Fax)    Agent: Please be advised that RX refills may take up to 3 business days. We ask that you follow-up with your pharmacy.

## 2018-07-25 NOTE — Telephone Encounter (Signed)
RX sent this morning

## 2018-07-28 ENCOUNTER — Other Ambulatory Visit: Payer: Self-pay | Admitting: Internal Medicine

## 2018-08-03 ENCOUNTER — Other Ambulatory Visit: Payer: Self-pay | Admitting: Internal Medicine

## 2018-08-09 ENCOUNTER — Other Ambulatory Visit (INDEPENDENT_AMBULATORY_CARE_PROVIDER_SITE_OTHER): Payer: PRIVATE HEALTH INSURANCE

## 2018-08-09 ENCOUNTER — Encounter: Payer: Self-pay | Admitting: Internal Medicine

## 2018-08-09 ENCOUNTER — Ambulatory Visit (INDEPENDENT_AMBULATORY_CARE_PROVIDER_SITE_OTHER): Payer: PRIVATE HEALTH INSURANCE | Admitting: Internal Medicine

## 2018-08-09 DIAGNOSIS — E119 Type 2 diabetes mellitus without complications: Secondary | ICD-10-CM | POA: Diagnosis not present

## 2018-08-09 DIAGNOSIS — E785 Hyperlipidemia, unspecified: Secondary | ICD-10-CM

## 2018-08-09 DIAGNOSIS — L723 Sebaceous cyst: Secondary | ICD-10-CM | POA: Diagnosis not present

## 2018-08-09 LAB — BASIC METABOLIC PANEL
BUN: 21 mg/dL (ref 6–23)
CHLORIDE: 100 meq/L (ref 96–112)
CO2: 28 mEq/L (ref 19–32)
Calcium: 9.5 mg/dL (ref 8.4–10.5)
Creatinine, Ser: 1.71 mg/dL — ABNORMAL HIGH (ref 0.40–1.50)
GFR: 49.02 mL/min — ABNORMAL LOW (ref >60.00)
Glucose, Bld: 148 mg/dL — ABNORMAL HIGH (ref 70–99)
Potassium: 4.4 mEq/L (ref 3.5–5.1)
Sodium: 135 mEq/L (ref 135–145)

## 2018-08-09 LAB — HEMOGLOBIN A1C: Hgb A1c MFr Bld: 8 % — ABNORMAL HIGH (ref 4.6–6.5)

## 2018-08-09 LAB — MICROALBUMIN / CREATININE URINE RATIO
Creatinine,U: 214.7 mg/dL
Microalb Creat Ratio: 37.8 mg/g — ABNORMAL HIGH (ref 0.0–30.0)
Microalb, Ur: 81.2 mg/dL — ABNORMAL HIGH (ref 0.0–1.9)

## 2018-08-09 MED ORDER — REPAGLINIDE 1 MG PO TABS: 1.0000 mg | ORAL_TABLET | Freq: Three times a day (TID) | ORAL | 3 refills | Status: DC

## 2018-08-09 MED ORDER — DOXAZOSIN MESYLATE 4 MG PO TABS: 4.0000 mg | ORAL_TABLET | Freq: Every day | ORAL | 3 refills | Status: DC

## 2018-08-09 MED ORDER — LOSARTAN POTASSIUM 100 MG PO TABS: 50.0000 mg | ORAL_TABLET | Freq: Every day | ORAL | 3 refills | Status: DC

## 2018-08-09 MED ORDER — AMLODIPINE BESYLATE 10 MG PO TABS: 10.0000 mg | ORAL_TABLET | Freq: Every day | ORAL | 3 refills | Status: DC

## 2018-08-09 MED ORDER — ATORVASTATIN CALCIUM 20 MG PO TABS: 20.0000 mg | ORAL_TABLET | Freq: Every day | ORAL | 3 refills | Status: DC

## 2018-08-09 MED ORDER — METFORMIN HCL 500 MG PO TABS: 500.0000 mg | ORAL_TABLET | Freq: Two times a day (BID) | ORAL | 3 refills | Status: DC

## 2018-08-09 NOTE — Progress Notes (Signed)
Subjective:  Patient ID: Troy Beck, male    DOB: September 06, 1955  Age: 63 y.o. MRN: [id number]  CC: No chief complaint on file.   HPI ANIKIN PROSSER presents for HTN, DM C/o a lump on the R neck x long time (6 mo) - not painful  Outpatient Medications Prior to Visit  Medication Sig Dispense Refill  . albuterol (PROVENTIL HFA;VENTOLIN HFA) 108 (90 Base) MCG/ACT inhaler Inhale 2 puffs into the lungs every 6 (six) hours as needed for wheezing or shortness of breath. 3 Inhaler 3  . amLODipine (NORVASC) 10 MG tablet TAKE 1 TABLET BY MOUTH DAILY 90 tablet 1  . atorvastatin (LIPITOR) 20 MG tablet TAKE 1 TABLET BY MOUTH DAILY 90 tablet 1  . atovaquone-proguanil (MALARONE) 250-100 MG TABS tablet Take 1 tablet by mouth daily. 90 tablet 1  . BREO ELLIPTA 100-25 MCG/INH AEPB INHALE 1 PUFF INTO THE LUNGS ONCE DAILY AS DIRECTED. *RINSE MOUTH AFTER USE* 60 each 8  . Cholecalciferol (D3-50) 50000 units capsule Take 1 capsule (50,000 Units total) by mouth every 30 (thirty) days. 3 capsule 3  . Cholecalciferol (VITAMIN D3) 2000 units capsule Take 1 capsule (2,000 Units total) by mouth daily. 100 capsule 3  . doxazosin (CARDURA) 4 MG tablet Take 1 tablet (4 mg total) by mouth at bedtime. Patient needs office visit before refills will be given 90 tablet 0  . glucose blood (ONETOUCH VERIO) test strip Use as instructed 100 each 11  . losartan (COZAAR) 100 MG tablet TAKE 1/2 TABLET BY MOUTH DAILY 90 tablet 1  . metFORMIN (GLUCOPHAGE) 500 MG tablet Take 1 tablet (500 mg total) by mouth 2 (two) times daily with a meal. 180 tablet 3  . mometasone (NASONEX) 50 MCG/ACT nasal spray Place 2 sprays into the nose daily.    . naproxen (NAPROSYN) 500 MG tablet Take 1 tablet (500 mg total) by mouth 2 (two) times daily with a meal. 60 tablet 3  . ONETOUCH DELICA LANCETS FINE MISC 1 Device by Does not apply route daily as needed. 100 each 3  . repaglinide (PRANDIN) 1 MG tablet Take 1 tablet (1 mg total) by mouth 3  (three) times daily before meals. Patient needs office visit before refills will be given 270 tablet 0  . tadalafil (CIALIS) 5 MG tablet Take 1 tablet (5 mg total) by mouth daily as needed for erectile dysfunction. 30 tablet 3  . zolpidem (AMBIEN) 10 MG tablet Take 0.5 tablets (5 mg total) by mouth at bedtime as needed for sleep. Patient needs office visit before refills will be given 45 tablet 1   No facility-administered medications prior to visit.     ROS: Review of Systems  Constitutional: Negative for appetite change, fatigue and unexpected weight change.  HENT: Negative for congestion, nosebleeds, sneezing, sore throat and trouble swallowing.   Eyes: Negative for itching and visual disturbance.  Respiratory: Negative for cough.   Cardiovascular: Negative for chest pain, palpitations and leg swelling.  Gastrointestinal: Negative for abdominal distention, blood in stool, diarrhea and nausea.  Genitourinary: Negative for frequency and hematuria.  Musculoskeletal: Negative for back pain, gait problem, joint swelling and neck pain.  Skin: Negative for rash.  Neurological: Negative for dizziness, tremors, speech difficulty and weakness.  Psychiatric/Behavioral: Negative for agitation, dysphoric mood, sleep disturbance and suicidal ideas. The patient is not nervous/anxious.     Objective:  BP 118/72 (BP Location: Left Arm, Patient Position: Sitting, Cuff Size: Large)   Pulse 66   Temp  98 F (36.7 C) (Oral)   Ht 5\' 8"  (1.727 m)   Wt 201 lb (91.2 kg)   SpO2 98%   BMI 30.56 kg/m   BP Readings from Last 3 Encounters:  08/09/18 118/72  09/14/17 114/80  03/02/17 108/64    Wt Readings from Last 3 Encounters:  08/09/18 201 lb (91.2 kg)  09/14/17 191 lb (86.6 kg)  03/02/17 192 lb (87.1 kg)    Physical Exam Constitutional:      General: He is not in acute distress.    Appearance: He is well-developed.     Comments: NAD  Eyes:     Conjunctiva/sclera: Conjunctivae normal.      Pupils: Pupils are equal, round, and reactive to light.  Neck:     Musculoskeletal: Normal range of motion.     Thyroid: No thyromegaly.     Vascular: No JVD.  Cardiovascular:     Rate and Rhythm: Normal rate and regular rhythm.     Heart sounds: Normal heart sounds. No murmur. No friction rub. No gallop.   Pulmonary:     Effort: Pulmonary effort is normal. No respiratory distress.     Breath sounds: Normal breath sounds. No wheezing or rales.  Chest:     Chest wall: No tenderness.  Abdominal:     General: Bowel sounds are normal. There is no distension.     Palpations: Abdomen is soft. There is no mass.     Tenderness: There is no abdominal tenderness. There is no guarding or rebound.  Musculoskeletal: Normal range of motion.        General: No tenderness.  Lymphadenopathy:     Cervical: No cervical adenopathy.  Skin:    General: Skin is warm and dry.     Findings: No rash.  Neurological:     Mental Status: He is alert and oriented to person, place, and time.     Cranial Nerves: No cranial nerve deficit.     Motor: No abnormal muscle tone.     Coordination: Coordination normal.     Gait: Gait normal.     Deep Tendon Reflexes: Reflexes are normal and symmetric.  Psychiatric:        Behavior: Behavior normal.        Thought Content: Thought content normal.        Judgment: Judgment normal.     R post neck 1x1 cm sebaceous cyst   Lab Results  Component Value Date   WBC 5.3 09/13/2017   HGB 14.6 09/13/2017   HCT 42.2 09/13/2017   PLT 202.0 09/13/2017   GLUCOSE 132 (H) 09/13/2017   CHOL 133 09/13/2017   TRIG 79.0 09/13/2017   HDL 41.80 09/13/2017   LDLCALC 76 09/13/2017   ALT 13 09/13/2017   AST 16 09/13/2017   NA 142 09/13/2017   K 3.6 09/13/2017   CL 102 09/13/2017   CREATININE 1.78 (H) 09/13/2017   BUN 20 09/13/2017   CO2 26 09/13/2017   TSH 1.22 09/13/2017   PSA 1.99 09/13/2017   HGBA1C 8.0 (H) 09/13/2017   MICROALBUR 223.3 (H) 12/05/2009    Ct Angio  Chest W/cm &/or Wo Cm  Result Date: 08/25/2010 *RADIOLOGY REPORT* Clinical Data:  Chest pain and shortness of breath.  Productive cough. CT ANGIOGRAPHY CHEST WITH CONTRAST Technique:  Multidetector CT imaging of the chest was performed using the standard protocol during bolus administration of intravenous contrast.  Multiplanar CT image reconstructions including MIPs were obtained to evaluate the vascular anatomy. Contrast:  75 ml of Omnipaque-300 Comparison:  None. Findings:  There are no pulmonary emboli.  There are no infiltrates or effusions or mass lesions.  Heart size and vascularity are normal.  No adenopathy.  The visualized portion of the upper abdomen is normal. Review of the MIP images confirms the above findings. IMPRESSION: Normal exam.  Specifically, no evidence of pulmonary emboli or other acute abnormalities. Original Report Authenticated By: Larey Seat, M.D.   Assessment & Plan:   There are no diagnoses linked to this encounter.   No orders of the defined types were placed in this encounter.    Follow-up: No follow-ups on file.  Walker Kehr, MD

## 2018-08-09 NOTE — Assessment & Plan Note (Signed)
R post neck 1x1 cm sebaceous cyst Derm ref

## 2018-08-09 NOTE — Assessment & Plan Note (Signed)
Metformin cardiac CT scan for calcium offered

## 2018-08-09 NOTE — Assessment & Plan Note (Signed)
Lipitor cardiac CT scan for calcium offered 1/20

## 2018-08-09 NOTE — Patient Instructions (Addendum)
Cardiac CT calcium scoring test $150   Computed tomography, more commonly known as a CT or CAT scan, is a diagnostic medical imaging test. Like traditional x-rays, it produces multiple images or pictures of the inside of the body. The cross-sectional images generated during a CT scan can be reformatted in multiple planes. They can even generate three-dimensional images. These images can be viewed on a computer monitor, printed on film or by a 3D printer, or transferred to a CD or DVD. CT images of internal organs, bones, soft tissue and blood vessels provide greater detail than traditional x-rays, particularly of soft tissues and blood vessels. A cardiac CT scan for coronary calcium is a non-invasive way of obtaining information about the presence, location and extent of calcified plaque in the coronary arteries-the vessels that supply oxygen-containing blood to the heart muscle. Calcified plaque results when there is a build-up of fat and other substances under the inner layer of the artery. This material can calcify which signals the presence of atherosclerosis, a disease of the vessel wall, also called coronary artery disease (CAD). People with this disease have an increased risk for heart attacks. In addition, over time, progression of plaque build up (CAD) can narrow the arteries or even close off blood flow to the heart. The result may be chest pain, sometimes called "angina," or a heart attack. Because calcium is a marker of CAD, the amount of calcium detected on a cardiac CT scan is a helpful prognostic tool. The findings on cardiac CT are expressed as a calcium score. Another name for this test is coronary artery calcium scoring.  What are some common uses of the procedure? The goal of cardiac CT scan for calcium scoring is to determine if CAD is present and to what extent, even if there are no symptoms. It is a screening study that may be recommended by a physician for patients with risk factors  for CAD but no clinical symptoms. The major risk factors for CAD are: . high blood cholesterol levels  . family history of heart attacks  . diabetes  . high blood pressure  . cigarette smoking  . overweight or obese  . physical inactivity   A negative cardiac CT scan for calcium scoring shows no calcification within the coronary arteries. This suggests that CAD is absent or so minimal it cannot be seen by this technique. The chance of having a heart attack over the next two to five years is very low under these circumstances. A positive test means that CAD is present, regardless of whether or not the patient is experiencing any symptoms. The amount of calcification-expressed as the calcium score-may help to predict the likelihood of a myocardial infarction (heart attack) in the coming years and helps your medical doctor or cardiologist decide whether the patient may need to take preventive medicine or undertake other measures such as diet and exercise to lower the risk for heart attack. The extent of CAD is graded according to your calcium score:  Calcium Score  Presence of CAD  0 No evidence of CAD   1-10 Minimal evidence of CAD  11-100 Mild evidence of CAD  101-400 Moderate evidence of CAD  Over 400 Extensive evidence of CAD    Epidermal Cyst  An epidermal cyst is a small, painless lump under your skin. The cyst contains a grayish-white, bad-smelling substance (keratin). Do not try to pop or open an epidermal cyst yourself. What are the causes?  A blocked hair follicle.  A  hair that curls and re-enters the skin instead of growing straight out of the skin.  A blocked pore.  Irritated skin.  An injury to the skin.  Certain conditions that are passed along from parent to child (inherited).  Human papillomavirus (HPV).  Long-term sun damage to the skin. What increases the risk?  Having acne.  Being overweight.  Being 64-58 years old. What are the signs or  symptoms? These cysts are usually harmless, but they can get infected. Symptoms of infection may include:  Redness.  Inflammation.  Tenderness.  Warmth.  Fever.  A grayish-white, bad-smelling substance drains from the cyst.  Pus drains from the cyst. How is this treated? In many cases, epidermal cysts go away on their own without treatment. If a cyst becomes infected, treatment may include:  Opening and draining the cyst, done by a doctor. After draining, you may need minor surgery to remove the rest of the cyst.  Antibiotic medicine.  Shots of medicines (steroids) that help to reduce inflammation.  Surgery to remove the cyst. Surgery may be done if the cyst: ? Becomes large. ? Bothers you. ? Has a chance of turning into cancer.  Do not try to open a cyst yourself. Follow these instructions at home:  Take over-the-counter and prescription medicines only as told by your doctor.  If you were prescribed an antibiotic medicine, take it it as told by your doctor. Do not stop using the antibiotic even if you start to feel better.  Keep the area around your cyst clean and dry.  Wear loose, dry clothing.  Avoid touching your cyst.  Check your cyst every day for signs of infection. Check for: ? Redness, swelling, or pain. ? Fluid or blood. ? Warmth. ? Pus or a bad smell.  Keep all follow-up visits as told by your doctor. This is important. How is this prevented?  Wear clean, dry, clothing.  Avoid wearing tight clothing.  Keep your skin clean and dry. Take showers or baths every day. Contact a doctor if:  Your cyst has symptoms of infection.  Your condition does not improve or gets worse.  You have a cyst that looks different from other cysts you have had.  You have a fever. Get help right away if:  Redness spreads from the cyst into the area close by. Summary  An epidermal cyst is a sac made of skin tissue.  If a cyst becomes infected, treatment may  include surgery to open and drain the cyst, or to remove it.  Take over-the-counter and prescription medicines only as told by your doctor.  Contact a doctor if your condition is not improving or is getting worse.  Keep all follow-up visits as told by your doctor. This is important. This information is not intended to replace advice given to you by your health care provider. Make sure you discuss any questions you have with your health care provider. Document Released: 08/13/2004 Document Revised: 01/17/2018 Document Reviewed: 05/08/2015 Elsevier Interactive Patient Education  2019 [employer].

## 2018-08-15 ENCOUNTER — Other Ambulatory Visit: Payer: Self-pay | Admitting: Internal Medicine

## 2018-08-15 MED ORDER — EMPAGLIFLOZIN 10 MG PO TABS: 10.0000 mg | ORAL_TABLET | Freq: Every day | ORAL | 11 refills | Status: DC

## 2018-10-11 ENCOUNTER — Telehealth: Payer: Self-pay | Admitting: Internal Medicine

## 2018-10-11 NOTE — Telephone Encounter (Signed)
Please advise 

## 2018-10-11 NOTE — Telephone Encounter (Signed)
Copied from [place] [id number]. Topic: General - Other >> Oct 11, 2018  8:46 AM Keene Breath wrote: Reason for CRM: Patient called to inform the doctor that his diabetic medicine, empagliflozin (JARDIANCE) 10 MG TABS tablet, has caused him a rash  on his right arm.  He has stopped taking it and would like to know what he should do now.  Please advise and call patient back at [phone].

## 2018-10-12 NOTE — Telephone Encounter (Signed)
Pt called and left a voicemail and seems very upset because no one has got in touch with him. Pt would like a call back as soon as possible. Pt states that he is having an allergic reaction to medication. Please advise

## 2018-10-12 NOTE — Telephone Encounter (Signed)
Pt.notified

## 2018-10-12 NOTE — Telephone Encounter (Signed)
Is he sure about the rash caused by Jardiance? pls sch OV or virtual OV if needed Thx

## 2018-10-12 NOTE — Telephone Encounter (Signed)
Please set up for virtual visit tomorrow at [phone]

## 2018-10-12 NOTE — Telephone Encounter (Signed)
Visit has been set up.

## 2018-10-13 ENCOUNTER — Encounter: Payer: Self-pay | Admitting: Internal Medicine

## 2018-10-13 ENCOUNTER — Ambulatory Visit (INDEPENDENT_AMBULATORY_CARE_PROVIDER_SITE_OTHER): Payer: PRIVATE HEALTH INSURANCE | Admitting: Internal Medicine

## 2018-10-13 DIAGNOSIS — E119 Type 2 diabetes mellitus without complications: Secondary | ICD-10-CM

## 2018-10-13 DIAGNOSIS — R21 Rash and other nonspecific skin eruption: Secondary | ICD-10-CM | POA: Diagnosis not present

## 2018-10-13 DIAGNOSIS — I10 Essential (primary) hypertension: Secondary | ICD-10-CM | POA: Diagnosis not present

## 2018-10-13 MED ORDER — TRIAMCINOLONE ACETONIDE 0.1 % EX CREA: 1.0000 "application " | TOPICAL_CREAM | Freq: Three times a day (TID) | CUTANEOUS | 1 refills | Status: DC

## 2018-10-13 NOTE — Assessment & Plan Note (Signed)
Continue current therapy with losartan, Cardura, Norvasc

## 2018-10-13 NOTE — Assessment & Plan Note (Signed)
We will put Jardiance on hold for another week.  He will continue with a home supply of Prandin.  Restart Jardiance in 1 week and watch closely for the rash

## 2018-10-13 NOTE — Progress Notes (Signed)
Virtual Visit via Telephone Note  I connected with Troy Beck on 10/13/18 at  8:50 AM EDT by telephone and verified that I am speaking with the correct person using two identifiers.   I discussed the limitations, risks, security and privacy concerns of performing an evaluation and management service by telephone and the availability of in person appointments. I also discussed with the patient that there may be a patient responsible charge related to this service. The patient expressed understanding and agreed to proceed.   History of Present Illness:   The patient is complaining of a rash on the both anterior arms and forearms that developed last week.  It was a hot day and since he has been about 35 minutes outside wearing short sleeves.  There was no new foods, detergents, new clothes etc.  There were several other places affected by the rash.  He started to take Zyrtec.  He stopped Jardiance since it was a noose medicine on his list.  The rash is getting better.  He started to take Prandin from all supply in place of the [place]. Observations/Objective: The patient looks well he is in no acute distress.  There is probably a an area of erythema covering the anterior arm and forearm on both sides.  Assessment and Plan: See problem list  Follow Up Instructions:    I discussed the assessment and treatment plan with the patient. The patient was provided an opportunity to ask questions and all were answered. The patient agreed with the plan and demonstrated an understanding of the instructions.   The patient was advised to call back or seek an in-person evaluation if the symptoms worsen or if the condition fails to improve as anticipated.  I provided 25 minutes of non-face-to-face time during this encounter.   Walker Kehr, MD

## 2018-10-13 NOTE — Assessment & Plan Note (Signed)
The rash of unclear etiology.  Possible sun sensitivity dermatitis.  It may have been drug-induced.  I doubt it was Jardiance, however.  [facility]amcinol[facility].  Take Zyrtec daily.  In 1 week if the rash is resolved, restart Jardiance.

## 2019-01-23 ENCOUNTER — Other Ambulatory Visit: Payer: Self-pay | Admitting: Internal Medicine

## 2019-02-01 ENCOUNTER — Telehealth: Payer: Self-pay

## 2019-02-01 NOTE — Telephone Encounter (Signed)
Copied from [place] [id number]. Topic: General - Other >> Feb 01, 2019  8:31 AM Leward Quan A wrote: Reason for CRM: Colletta [place] with [facility] called to say that patient appointment was cancelled and they had issues getting in touch with the patient but will be sending him a letter. The doctor had surgery so they just wanted to inform Dr Alain Marion

## 2019-06-30 ENCOUNTER — Other Ambulatory Visit: Payer: Self-pay | Admitting: Internal Medicine

## 2019-07-15 ENCOUNTER — Other Ambulatory Visit: Payer: Self-pay | Admitting: Internal Medicine

## 2019-08-02 ENCOUNTER — Other Ambulatory Visit: Payer: Self-pay | Admitting: Internal Medicine

## 2019-08-14 ENCOUNTER — Other Ambulatory Visit: Payer: Self-pay | Admitting: Internal Medicine

## 2019-09-09 ENCOUNTER — Ambulatory Visit: Payer: PRIVATE HEALTH INSURANCE | Attending: Internal Medicine

## 2019-09-09 DIAGNOSIS — Z23 Encounter for immunization: Secondary | ICD-10-CM | POA: Insufficient documentation

## 2019-09-09 NOTE — Progress Notes (Signed)
   Covid-19 Vaccination Clinic  Name:  Troy Beck    MRN: [id number] DOB: [birth year]  09/09/2019  [name] was observed post Covid-19 immunization for 15 minutes without incidence. He was provided with Vaccine Information Sheet and instruction to access the V-Safe system.   [name] was instructed to call 911 with any severe reactions post vaccine: [name] Difficulty breathing  . Swelling of your face and throat  . A fast heartbeat  . A bad rash all over your body  . Dizziness and weakness    Immunizations Administered    Name Date Dose VIS Date Route   Pfizer COVID-19 Vaccine 09/09/2019 10:05 AM 0.3 mL 06/30/2019 Intramuscular   Manufacturer: [place]   Lot: [id number]   [place]: [id number]

## 2019-09-11 ENCOUNTER — Other Ambulatory Visit: Payer: Self-pay | Admitting: Internal Medicine

## 2019-09-11 MED ORDER — JARDIANCE 10 MG PO TABS: 10.0000 mg | ORAL_TABLET | Freq: Every day | ORAL | 0 refills | Status: DC

## 2019-09-11 NOTE — Telephone Encounter (Signed)
° ° °  1. Which medications need to be refilled? (please list name of each medication and dose if known) empagliflozin (JARDIANCE) 10 MG TABS tablet  2. Which pharmacy/location (including street and city if local pharmacy) is medication to be sent to?[id number] [place] [place] [facility] [address]  3. Do they need a 30 day or 90 day supply? [place]

## 2019-09-29 ENCOUNTER — Other Ambulatory Visit: Payer: Self-pay

## 2019-09-29 MED ORDER — JARDIANCE 10 MG PO TABS: 10.0000 mg | ORAL_TABLET | Freq: Every day | ORAL | 0 refills | Status: DC

## 2019-10-01 ENCOUNTER — Other Ambulatory Visit: Payer: Self-pay | Admitting: Internal Medicine

## 2019-10-03 ENCOUNTER — Ambulatory Visit: Payer: PRIVATE HEALTH INSURANCE | Attending: Internal Medicine

## 2019-10-03 DIAGNOSIS — Z23 Encounter for immunization: Secondary | ICD-10-CM

## 2019-10-03 NOTE — Progress Notes (Signed)
   Covid-19 Vaccination Clinic  Name:  Troy Beck    MRN: [id number] DOB: 13-Nov-1955  10/03/2019  Troy Beck was observed post Covid-19 immunization for 15 minutes without incident. He was provided with Vaccine Information Sheet and instruction to access the V-Safe system.   Troy Beck was instructed to call 911 with any severe reactions post vaccine: [name] Difficulty breathing  . Swelling of face and throat  . A fast heartbeat  . A bad rash all over body  . Dizziness and weakness   Immunizations Administered    Name Date Dose VIS Date Route   Pfizer COVID-19 Vaccine 10/03/2019 10:03 AM 0.3 mL 06/30/2019 Intramuscular   Manufacturer: [place]   Lot: [id number]   [place]: [id number]

## 2019-10-20 ENCOUNTER — Other Ambulatory Visit: Payer: Self-pay | Admitting: Internal Medicine

## 2019-10-25 ENCOUNTER — Other Ambulatory Visit: Payer: Self-pay | Admitting: Internal Medicine

## 2019-10-28 ENCOUNTER — Other Ambulatory Visit: Payer: Self-pay | Admitting: Internal Medicine

## 2019-10-31 ENCOUNTER — Encounter: Payer: Self-pay | Admitting: Internal Medicine

## 2019-10-31 ENCOUNTER — Other Ambulatory Visit: Payer: Self-pay

## 2019-10-31 ENCOUNTER — Ambulatory Visit (INDEPENDENT_AMBULATORY_CARE_PROVIDER_SITE_OTHER): Payer: PRIVATE HEALTH INSURANCE | Admitting: Internal Medicine

## 2019-10-31 DIAGNOSIS — R3911 Hesitancy of micturition: Secondary | ICD-10-CM | POA: Diagnosis not present

## 2019-10-31 DIAGNOSIS — E119 Type 2 diabetes mellitus without complications: Secondary | ICD-10-CM

## 2019-10-31 DIAGNOSIS — J4520 Mild intermittent asthma, uncomplicated: Secondary | ICD-10-CM | POA: Diagnosis not present

## 2019-10-31 DIAGNOSIS — E785 Hyperlipidemia, unspecified: Secondary | ICD-10-CM | POA: Diagnosis not present

## 2019-10-31 DIAGNOSIS — E1121 Type 2 diabetes mellitus with diabetic nephropathy: Secondary | ICD-10-CM | POA: Diagnosis not present

## 2019-10-31 DIAGNOSIS — N401 Enlarged prostate with lower urinary tract symptoms: Secondary | ICD-10-CM | POA: Diagnosis not present

## 2019-10-31 DIAGNOSIS — I10 Essential (primary) hypertension: Secondary | ICD-10-CM

## 2019-10-31 LAB — CBC WITH DIFFERENTIAL/PLATELET
Basophils Absolute: 0.1 10*3/uL (ref 0.0–0.1)
Basophils Relative: 2 % (ref 0.0–3.0)
Eosinophils Absolute: 0.2 10*3/uL (ref 0.0–0.7)
Eosinophils Relative: 4.3 % (ref 0.0–5.0)
HCT: 47.4 % (ref 39.0–52.0)
Hemoglobin: 15.6 g/dL (ref 13.0–17.0)
Lymphocytes Relative: 40.1 % (ref 12.0–46.0)
Lymphs Abs: 1.9 10*3/uL (ref 0.7–4.0)
MCHC: 33 g/dL (ref 30.0–36.0)
MCV: 87.4 fl (ref 78.0–100.0)
Monocytes Absolute: 0.5 10*3/uL (ref 0.1–1.0)
Monocytes Relative: 10.2 % (ref 3.0–12.0)
Neutro Abs: 2 10*3/uL (ref 1.4–7.7)
Neutrophils Relative %: 43.4 % (ref 43.0–77.0)
Platelets: 169 10*3/uL (ref 150.0–400.0)
RBC: 5.43 Mil/uL (ref 4.22–5.81)
RDW: 13.7 % (ref 11.5–15.5)
WBC: 4.6 10*3/uL (ref 4.0–10.5)

## 2019-10-31 LAB — HEPATIC FUNCTION PANEL
ALT: 19 U/L (ref 0–53)
AST: 19 U/L (ref 0–37)
Albumin: 4.1 g/dL (ref 3.5–5.2)
Alkaline Phosphatase: 77 U/L (ref 39–117)
Bilirubin, Direct: 0.1 mg/dL (ref 0.0–0.3)
Total Bilirubin: 0.6 mg/dL (ref 0.2–1.2)
Total Protein: 7 g/dL (ref 6.0–8.3)

## 2019-10-31 LAB — BASIC METABOLIC PANEL
BUN: 27 mg/dL — ABNORMAL HIGH (ref 6–23)
CO2: 25 mEq/L (ref 19–32)
Calcium: 9.5 mg/dL (ref 8.4–10.5)
Chloride: 101 mEq/L (ref 96–112)
Creatinine, Ser: 1.45 mg/dL (ref 0.40–1.50)
GFR: 59.06 mL/min — ABNORMAL LOW (ref >60.00)
Glucose, Bld: 194 mg/dL — ABNORMAL HIGH (ref 70–99)
Potassium: 4.5 mEq/L (ref 3.5–5.1)
Sodium: 133 mEq/L — ABNORMAL LOW (ref 135–145)

## 2019-10-31 LAB — LIPID PANEL
Cholesterol: 231 mg/dL — ABNORMAL HIGH (ref 0–200)
HDL: 42.7 mg/dL (ref >39.00)
LDL Cholesterol: 159 mg/dL — ABNORMAL HIGH (ref 0–99)
NonHDL: 188.44
Total CHOL/HDL Ratio: 5
Triglycerides: 147 mg/dL (ref 0.0–149.0)
VLDL: 29.4 mg/dL (ref 0.0–40.0)

## 2019-10-31 LAB — PSA: PSA: 1.97 ng/mL (ref 0.10–4.00)

## 2019-10-31 LAB — MICROALBUMIN / CREATININE URINE RATIO
Creatinine,U: 43.4 mg/dL
Microalb Creat Ratio: 150.4 mg/g — ABNORMAL HIGH (ref 0.0–30.0)
Microalb, Ur: 65.3 mg/dL — ABNORMAL HIGH (ref 0.0–1.9)

## 2019-10-31 LAB — HEMOGLOBIN A1C: Hgb A1c MFr Bld: 8.4 % — ABNORMAL HIGH (ref 4.6–6.5)

## 2019-10-31 LAB — TSH: TSH: 1.1 u[IU]/mL (ref 0.35–4.50)

## 2019-10-31 MED ORDER — JARDIANCE 10 MG PO TABS: 10.0000 mg | ORAL_TABLET | Freq: Every day | ORAL | 0 refills | Status: DC

## 2019-10-31 MED ORDER — METFORMIN HCL 500 MG PO TABS: 500.0000 mg | ORAL_TABLET | Freq: Two times a day (BID) | ORAL | 3 refills | Status: DC

## 2019-10-31 MED ORDER — DOXAZOSIN MESYLATE 4 MG PO TABS: ORAL_TABLET | ORAL | 3 refills | Status: DC

## 2019-10-31 MED ORDER — BREO ELLIPTA 100-25 MCG/INH IN AEPB: INHALATION_SPRAY | RESPIRATORY_TRACT | 3 refills | Status: DC

## 2019-10-31 MED ORDER — REPAGLINIDE 1 MG PO TABS: 1.0000 mg | ORAL_TABLET | Freq: Three times a day (TID) | ORAL | 3 refills | Status: DC

## 2019-10-31 MED ORDER — AMLODIPINE BESYLATE 10 MG PO TABS: 10.0000 mg | ORAL_TABLET | Freq: Every day | ORAL | 3 refills | Status: DC

## 2019-10-31 MED ORDER — LOSARTAN POTASSIUM 100 MG PO TABS: 50.0000 mg | ORAL_TABLET | Freq: Every day | ORAL | 3 refills | Status: DC

## 2019-10-31 MED ORDER — ATORVASTATIN CALCIUM 20 MG PO TABS: 20.0000 mg | ORAL_TABLET | Freq: Every day | ORAL | 3 refills | Status: DC

## 2019-10-31 MED ORDER — TADALAFIL 5 MG PO TABS: 5.0000 mg | ORAL_TABLET | Freq: Every day | ORAL | 3 refills | Status: DC

## 2019-10-31 NOTE — Progress Notes (Signed)
Subjective:  Patient ID: Troy Beck, male    DOB: Sep 19, 1955  Age: 64 y.o. MRN: [id number]  CC: No chief complaint on file.   HPI Troy Beck presents for HTN, DM, BPH f/u  Outpatient Medications Prior to Visit  Medication Sig Dispense Refill  . albuterol (PROVENTIL HFA;VENTOLIN HFA) 108 (90 Base) MCG/ACT inhaler Inhale 2 puffs into the lungs every 6 (six) hours as needed for wheezing or shortness of breath. 3 Inhaler 3  . amLODipine (NORVASC) 10 MG tablet TAKE 1 TABLET (10 MG TOTAL) BY MOUTH DAILY. ANNUAL APPT IS W/LABS MUST SEE PROVIDER FOR FUTURE REFILLS 90 tablet 3  . atorvastatin (LIPITOR) 20 MG tablet Take 1 tablet (20 mg total) by mouth daily. 90 tablet 3  . atovaquone-proguanil (MALARONE) 250-100 MG TABS tablet Take 1 tablet by mouth daily. 90 tablet 1  . BREO ELLIPTA 100-25 MCG/INH AEPB INHALE 1 PUFF INTO THE LUNGS ONCE DAILY AS DIRECTED. *RINSE MOUTH AFTER USE* 1 each 5  . Cholecalciferol (D3-50) 50000 units capsule Take 1 capsule (50,000 Units total) by mouth every 30 (thirty) days. 3 capsule 3  . Cholecalciferol (VITAMIN D3) 2000 units capsule Take 1 capsule (2,000 Units total) by mouth daily. 100 capsule 3  . doxazosin (CARDURA) 4 MG tablet TAKE 1 TABLET (4 MG TOTAL) BY MOUTH DAILY. MUST SEE PROVIDER IN [facility] FOR FUTURE REFILLS 90 tablet 0  . empagliflozin (JARDIANCE) 10 MG TABS tablet Take 10 mg by mouth daily. Annual appt is w/labs due in must see provider for future refills 30 tablet 0  . glucose blood (ONETOUCH VERIO) test strip Use as instructed 100 each 11  . losartan (COZAAR) 100 MG tablet Take 0.5 tablets (50 mg total) by mouth daily. 90 tablet 3  . metFORMIN (GLUCOPHAGE) 500 MG tablet Take 1 tablet (500 mg total) by mouth 2 (two) times daily with a meal. 180 tablet 3  . mometasone (NASONEX) 50 MCG/ACT nasal spray Place 2 sprays into the nose daily.    . naproxen (NAPROSYN) 500 MG tablet Take 1 tablet (500 mg total) by mouth 2 (two) times daily with a meal.  60 tablet 3  . ONETOUCH DELICA LANCETS FINE MISC 1 Device by Does not apply route daily as needed. 100 each 3  . repaglinide (PRANDIN) 1 MG tablet Take 1 tablet (1 mg total) by mouth 3 (three) times daily before meals. Patient needs office visit before refills will be given 270 tablet 3  . tadalafil (CIALIS) 5 MG tablet Take 1 tablet (5 mg total) by mouth daily as needed for erectile dysfunction. 30 tablet 3  . triamcinolone cream (KENALOG) 0.1 % Apply 1 application topically 3 (three) times daily. 80 g 1  . zolpidem (AMBIEN) 10 MG tablet TAKE ONE-HALF TABLET AT BEDTIME AS NEEDED FOR SLEEP *NEED OFFICE VISIT BEFORE REFILLS WILL BE GIVEN 45 tablet 1   No facility-administered medications prior to visit.    ROS: Review of Systems  Constitutional: Negative for appetite change, fatigue and unexpected weight change.  HENT: Negative for congestion, nosebleeds, sneezing, sore throat and trouble swallowing.   Eyes: Negative for itching and visual disturbance.  Respiratory: Negative for cough.   Cardiovascular: Negative for chest pain, palpitations and leg swelling.  Gastrointestinal: Negative for abdominal distention, blood in stool, diarrhea and nausea.  Genitourinary: Negative for frequency and hematuria.  Musculoskeletal: Negative for back pain, gait problem, joint swelling and neck pain.  Skin: Negative for rash.  Neurological: Negative for dizziness, tremors, speech difficulty  and weakness.  Psychiatric/Behavioral: Negative for agitation, dysphoric mood and sleep disturbance. The patient is not nervous/anxious.     Objective:  BP (!) 144/86 (BP Location: Left Arm, Patient Position: Sitting, Cuff Size: Large)   Pulse 75   Temp 98.3 F (36.8 C) (Oral)   Ht 5\' 8"  (1.727 m)   Wt 195 lb (88.5 kg)   SpO2 97%   BMI 29.65 kg/m   BP Readings from Last 3 Encounters:  10/31/19 (!) 144/86  08/09/18 118/72  09/14/17 114/80    Wt Readings from Last 3 Encounters:  10/31/19 195 lb (88.5 kg)    08/09/18 201 lb (91.2 kg)  09/14/17 191 lb (86.6 kg)    Physical Exam Constitutional:      General: He is not in acute distress.    Appearance: He is well-developed.     Comments: NAD  Eyes:     Conjunctiva/sclera: Conjunctivae normal.     Pupils: Pupils are equal, round, and reactive to light.  Neck:     Thyroid: No thyromegaly.     Vascular: No JVD.  Cardiovascular:     Rate and Rhythm: Normal rate and regular rhythm.     Heart sounds: Normal heart sounds. No murmur. No friction rub. No gallop.   Pulmonary:     Effort: Pulmonary effort is normal. No respiratory distress.     Breath sounds: Normal breath sounds. No wheezing or rales.  Chest:     Chest wall: No tenderness.  Abdominal:     General: Bowel sounds are normal. There is no distension.     Palpations: Abdomen is soft. There is no mass.     Tenderness: There is no abdominal tenderness. There is no guarding or rebound.  Musculoskeletal:        General: No tenderness. Normal range of motion.     Cervical back: Normal range of motion.  Lymphadenopathy:     Cervical: No cervical adenopathy.  Skin:    General: Skin is warm and dry.     Findings: No rash.  Neurological:     Mental Status: He is alert and oriented to person, place, and time.     Cranial Nerves: No cranial nerve deficit.     Motor: No abnormal muscle tone.     Coordination: Coordination normal.     Gait: Gait normal.     Deep Tendon Reflexes: Reflexes are normal and symmetric.  Psychiatric:        Behavior: Behavior normal.        Thought Content: Thought content normal.        Judgment: Judgment normal.     Lab Results  Component Value Date   WBC 5.3 09/13/2017   HGB 14.6 09/13/2017   HCT 42.2 09/13/2017   PLT 202.0 09/13/2017   GLUCOSE 148 (H) 08/09/2018   CHOL 133 09/13/2017   TRIG 79.0 09/13/2017   HDL 41.80 09/13/2017   LDLCALC 76 09/13/2017   ALT 13 09/13/2017   AST 16 09/13/2017   NA 135 08/09/2018   K 4.4 08/09/2018   CL 100  08/09/2018   CREATININE 1.71 (H) 08/09/2018   BUN 21 08/09/2018   CO2 28 08/09/2018   TSH 1.22 09/13/2017   PSA 1.99 09/13/2017   HGBA1C 8.0 (H) 08/09/2018   MICROALBUR 81.2 (H) 08/09/2018    CT Angio Chest W/Cm &/Or Wo Cm  Result Date: 08/25/2010 *RADIOLOGY REPORT* Clinical Data:  Chest pain and shortness of breath.  Productive cough. CT ANGIOGRAPHY CHEST  WITH CONTRAST Technique:  Multidetector CT imaging of the chest was performed using the standard protocol during bolus administration of intravenous contrast.  Multiplanar CT image reconstructions including MIPs were obtained to evaluate the vascular anatomy. Contrast:  75 ml of Omnipaque-300 Comparison:  None. Findings:  There are no pulmonary emboli.  There are no infiltrates or effusions or mass lesions.  Heart size and vascularity are normal.  No adenopathy.  The visualized portion of the upper abdomen is normal. Review of the MIP images confirms the above findings. IMPRESSION: Normal exam.  Specifically, no evidence of pulmonary emboli or other acute abnormalities. Original Report Authenticated By: Larey Seat, M.D.   Assessment & Plan:    Walker Kehr, MD

## 2019-10-31 NOTE — Assessment & Plan Note (Signed)
Labs Losartan HCT

## 2019-10-31 NOTE — Patient Instructions (Signed)

## 2019-10-31 NOTE — Assessment & Plan Note (Signed)
Cialis daily Doxazosyn

## 2019-10-31 NOTE — Telephone Encounter (Signed)
Pt has appt this afternoon.Marland KitchenJohny Beck

## 2019-10-31 NOTE — Assessment & Plan Note (Signed)
On Metformin, Jardiance 

## 2019-10-31 NOTE — Assessment & Plan Note (Signed)
Lipitor 

## 2019-10-31 NOTE — Assessment & Plan Note (Signed)
Losartan HCT, Cardura, Norvasc

## 2019-10-31 NOTE — Assessment & Plan Note (Signed)
Proventil Seeing an allergist

## 2019-11-01 LAB — URINALYSIS, ROUTINE W REFLEX MICROSCOPIC
Bilirubin Urine: NEGATIVE
Hgb urine dipstick: NEGATIVE
Ketones, ur: NEGATIVE
Leukocytes,Ua: NEGATIVE
Nitrite: NEGATIVE
Specific Gravity, Urine: 1.015 (ref 1.000–1.030)
Total Protein, Urine: 100 — AB
Urine Glucose: >=1000 — AB
Urobilinogen, UA: 0.2 (ref 0.0–1.0)
pH: 6.5 (ref 5.0–8.0)

## 2019-11-04 ENCOUNTER — Other Ambulatory Visit: Payer: Self-pay | Admitting: Internal Medicine

## 2019-11-04 DIAGNOSIS — E1169 Type 2 diabetes mellitus with other specified complication: Secondary | ICD-10-CM

## 2019-11-10 ENCOUNTER — Telehealth: Payer: Self-pay | Admitting: Internal Medicine

## 2019-11-10 ENCOUNTER — Other Ambulatory Visit: Payer: Self-pay

## 2019-11-10 MED ORDER — JARDIANCE 10 MG PO TABS: 10.0000 mg | ORAL_TABLET | Freq: Every day | ORAL | 1 refills | Status: DC

## 2019-11-10 MED ORDER — ATORVASTATIN CALCIUM 20 MG PO TABS: 20.0000 mg | ORAL_TABLET | Freq: Every day | ORAL | 3 refills | Status: DC

## 2019-11-10 NOTE — Telephone Encounter (Signed)
New message:    Pt is calling and states he needs someone to call him and tell him about his lab results. Pt states he was referred to a diabetes specialists but he doesn't know why. Please advise.

## 2019-11-10 NOTE — Telephone Encounter (Signed)
Pt notified of lab results

## 2019-11-20 ENCOUNTER — Telehealth: Payer: Self-pay

## 2019-11-20 NOTE — Telephone Encounter (Signed)
New message    The patient voiced he will need a prescription for glucose monitor to test his blood sugar he asking for the one where he  does not have to stick his finger.   [id number] [place] [place]

## 2019-11-21 ENCOUNTER — Encounter: Payer: Self-pay | Admitting: Internal Medicine

## 2019-11-21 NOTE — Telephone Encounter (Signed)
Please advise 

## 2019-11-22 MED ORDER — FREESTYLE LIBRE 14 DAY READER DEVI: 0 refills | Status: DC

## 2019-11-22 MED ORDER — FREESTYLE LIBRE SENSOR SYSTEM MISC: 0 refills | Status: DC

## 2019-11-22 MED ORDER — FREESTYLE LIBRE 14 DAY SENSOR MISC: 3 refills | Status: DC

## 2019-11-22 NOTE — Telephone Encounter (Signed)
[place] if covered [employer]

## 2019-11-22 NOTE — Telephone Encounter (Signed)
F/u  The patient calling back with pharmacy information, asking can anything be sent to his pharmacy whether it be sticking a finger or not he just wants something call into his pharmacy  The patient voiced that he will deal with his insurance company or he will pay out of pocket.     [id number] [place], [place]

## 2019-11-22 NOTE — Telephone Encounter (Signed)
RX sent

## 2019-12-11 ENCOUNTER — Ambulatory Visit (INDEPENDENT_AMBULATORY_CARE_PROVIDER_SITE_OTHER): Payer: PRIVATE HEALTH INSURANCE | Admitting: Internal Medicine

## 2019-12-11 ENCOUNTER — Encounter: Payer: Self-pay | Admitting: Internal Medicine

## 2019-12-11 ENCOUNTER — Other Ambulatory Visit: Payer: Self-pay

## 2019-12-11 VITALS — BP 128/72 | HR 82 | Temp 98.5°F | Ht 68.0 in | Wt 193.2 lb

## 2019-12-11 DIAGNOSIS — E1165 Type 2 diabetes mellitus with hyperglycemia: Secondary | ICD-10-CM

## 2019-12-11 DIAGNOSIS — E1121 Type 2 diabetes mellitus with diabetic nephropathy: Secondary | ICD-10-CM

## 2019-12-11 DIAGNOSIS — N1831 Chronic kidney disease, stage 3a: Secondary | ICD-10-CM | POA: Diagnosis not present

## 2019-12-11 DIAGNOSIS — E785 Hyperlipidemia, unspecified: Secondary | ICD-10-CM | POA: Diagnosis not present

## 2019-12-11 DIAGNOSIS — E1122 Type 2 diabetes mellitus with diabetic chronic kidney disease: Secondary | ICD-10-CM | POA: Insufficient documentation

## 2019-12-11 MED ORDER — ACCU-CHEK GUIDE VI STRP: ORAL_STRIP | 12 refills | Status: DC

## 2019-12-11 NOTE — Patient Instructions (Signed)
-   Keep up the good work ! You have done an amazing work with lifestyle changes - Continue Metformin 500 mg, 1 tablet twice a day  - Continue Jardiance 10 mg daily       HOW TO TREAT LOW BLOOD SUGARS (Blood sugar LESS THAN 70 MG/DL)  Please follow the RULE OF 15 for the treatment of hypoglycemia treatment (when your (blood sugars are less than 70 mg/dL)    STEP 1: Take 15 grams of carbohydrates when your blood sugar is low, which includes:   3-4 GLUCOSE TABS  OR  3-4 OZ OF JUICE OR REGULAR SODA OR  ONE TUBE OF GLUCOSE GEL     STEP 2: RECHECK blood sugar in 15 MINUTES STEP 3: If your blood sugar is still low at the 15 minute recheck --> then, go back to STEP 1 and treat AGAIN with another 15 grams of carbohydrates.

## 2019-12-11 NOTE — Progress Notes (Signed)
Name: Troy Beck  MRN/ DOB: [id number], August 28, 1955   Age/ Sex: 64 y.o., male    PCP: Plotnikov, Evie Lacks, MD   Reason for Endocrinology Evaluation: Type 2 Diabetes Mellitus     Date of Initial Endocrinology Visit: 12/11/2019     PATIENT IDENTIFIER: Troy Beck is a 64 y.o. male with a past medical history of HTN, T2DM and Dyslipidemia. The patient presented for initial endocrinology clinic visit on 12/11/2019 for consultative assistance with his diabetes management.    HPI: Troy Beck was    Diagnosed with DM 1995 Prior Medications tried/Intolerance: was on metformin, restarted 10/2019.  Currently checking blood sugars multiple times a day through CGM   Hypoglycemia episodes :yes                        Frequency:2/ week Hemoglobin A1c has ranged from 6.7% in 2012, peaking at 8.4% in 2021. Patient required assistance for hypoglycemia: no Patient has required hospitalization within the last 1 year from hyper or hypoglycemia: no   In terms of diet, the patient eats 3 meals a day, snacks rarely.  Avoids sugar-sweetened beverages.    HOME DIABETES REGIMEN: Metformin 500 mg BID  Jardiance 10 mg daily  Repaglinide- not taking anymore      Statin: yes ACE-I/ARB: yes     CONTINUOUS GLUCOSE MONITORING RECORD INTERPRETATION    Dates of Recording: 5/11-5/24/2021  Sensor description:Freestyle Libre  Results statistics:   CGM use % of time 96  Average and SD 104/29.6  Time in range  89      %  % Time Above 180 4  % Time above 250 0  % Time Below target 7     Glycemic patterns summary: optimal glycemic control through the day with tight BG's overnight   Hyperglycemic episodes  Post-prandial   Hypoglycemic episodes occurred also post-prandial   Overnight periods: trending down       DIABETIC COMPLICATIONS: Microvascular complications:   CKD III  Denies: retinopathy, neuropathy    Last eye exam: Completed 09/2019  Macrovascular  complications:    Denies: CAD, PVD, CVA   PAST HISTORY: Past Medical History:  Past Medical History:  Diagnosis Date  . Diabetes mellitus   . Hyperlipidemia   . Hypertension     Past Surgical History: No past surgical history on file.   Social History:  reports that he has never smoked. He has never used smokeless tobacco. He reports that he does not drink alcohol or use drugs. Family History:  Family History  Problem Relation Age of Onset  . Heart disease Mother 75       ?aneurism, brain  . Heart disease Father 6       ?MI  . Hypertension Sister   . Stroke Brother      HOME MEDICATIONS: Allergies as of 12/11/2019      Reactions   Quinine Derivatives       Medication List       Accurate as of Dec 11, 2019  2:44 PM. If you have any questions, ask your nurse or doctor.        STOP taking these medications   repaglinide 1 MG tablet Commonly known as: PRANDIN Stopped by: Dorita Sciara, MD     TAKE these medications   albuterol 108 (90 Base) MCG/ACT inhaler Commonly known as: VENTOLIN HFA Inhale 2 puffs into the lungs every 6 (six) hours as needed for wheezing or  shortness of breath.   amLODipine 10 MG tablet Commonly known as: NORVASC Take 1 tablet (10 mg total) by mouth daily. Annual appt is w/labs must see provider for future refills   atorvastatin 20 MG tablet Commonly known as: LIPITOR Take 1 tablet (20 mg total) by mouth daily.   atovaquone-proguanil 250-100 MG Tabs tablet Commonly known as: Malarone Take 1 tablet by mouth daily.   Breo Ellipta 100-25 MCG/INH Aepb Generic drug: fluticasone furoate-vilanterol INHALE 1 PUFF INTO THE LUNGS ONCE DAILY AS DIRECTED. *RINSE MOUTH AFTER USE*   brimonidine 0.2 % ophthalmic solution Commonly known as: ALPHAGAN 3 (three) times daily.   dorzolamide 2 % ophthalmic solution Commonly known as: TRUSOPT 1 drop 3 (three) times daily.   doxazosin 4 MG tablet Commonly known as: CARDURA TAKE 1 TABLET  (4 MG TOTAL) BY MOUTH DAILY. MUST SEE PROVIDER IN [facility] FOR FUTURE REFILLS   FreeStyle Libre 14 Day Reader Kerrin Mo Use to check blood sugar daily DX E11.69   FreeStyle Libre 14 Day Sensor Misc Use to check blood sugar daily DX E11.69   FreeStyle Libre Sensor System Misc Use to check blood sugar daily DX E11.69   glucose blood test strip Commonly known as: Civil engineer, contracting Use as instructed   Jardiance 10 MG Tabs tablet Generic drug: empagliflozin Take 10 mg by mouth daily.   latanoprost 0.005 % ophthalmic solution Commonly known as: XALATAN 1 drop at bedtime.   losartan 100 MG tablet Commonly known as: COZAAR Take 0.5 tablets (50 mg total) by mouth daily.   metFORMIN 500 MG tablet Commonly known as: GLUCOPHAGE Take 1 tablet (500 mg total) by mouth 2 (two) times daily with a meal.   mometasone 50 MCG/ACT nasal spray Commonly known as: NASONEX Place 2 sprays into the nose daily.   naproxen 500 MG tablet Commonly known as: Naprosyn Take 1 tablet (500 mg total) by mouth 2 (two) times daily with a meal.   OneTouch Delica Lancets Fine Misc 1 Device by Does not apply route daily as needed.   tadalafil 5 MG tablet Commonly known as: Cialis Take 1 tablet (5 mg total) by mouth daily.   triamcinolone cream 0.1 % Commonly known as: KENALOG Apply 1 application topically 3 (three) times daily.   Vitamin D3 50 MCG (2000 UT) capsule Take 1 capsule (2,000 Units total) by mouth daily.   Cholecalciferol 1.25 MG (50000 UT) capsule Commonly known as: D3-50 Take 1 capsule (50,000 Units total) by mouth every 30 (thirty) days.   zolpidem 10 MG tablet Commonly known as: AMBIEN TAKE ONE-HALF TABLET AT BEDTIME AS NEEDED FOR SLEEP *NEED OFFICE VISIT BEFORE REFILLS WILL BE GIVEN        ALLERGIES: Allergies  Allergen Reactions  . Quinine Derivatives      REVIEW OF SYSTEMS: A comprehensive ROS was conducted with the patient and is negative except as per HPI and below:  Review of  Systems  Gastrointestinal: Negative for diarrhea and nausea.  Neurological: Negative for tingling and tremors.      OBJECTIVE:   VITAL SIGNS: BP 128/72 (BP Location: Right Arm, Patient Position: Sitting, Cuff Size: Normal)   Pulse 82   Temp 98.5 F (36.9 C)   Ht 5\' 8"  (1.727 m)   Wt 193 lb 3.2 oz (87.6 kg)   SpO2 98%   BMI 29.38 kg/m    PHYSICAL EXAM:  General: Pt appears well and is in NAD  HEENT:  Eyes: External eye exam normal without stare, lid lag or  exophthalmos.  EOM intact.    Neck: General: Supple without adenopathy or carotid bruits. Thyroid: Thyroid size normal.  No goiter or nodules appreciated. No thyroid bruit.  Lungs: Clear with good BS bilat with no rales, rhonchi, or wheezes  Heart: RRR with normal S1 and S2 and no gallops; no murmurs; no rub  Abdomen: Normoactive bowel sounds, soft, nontender, without masses or organomegaly palpable  Extremities:  Lower extremities - No pretibial edema. No lesions.  Skin: Normal texture and temperature to palpation.   Neuro: MS is good with appropriate affect, pt is alert and Ox3    DM foot exam: 12/11/2019 The skin of the feet is intact without sores or ulcerations. The pedal pulses are 2+ on right and 2+ on left. The sensation is intact to a screening 5.07, 10 gram monofilament bilaterally   DATA REVIEWED:  Lab Results  Component Value Date   HGBA1C 8.4 (H) 10/31/2019   HGBA1C 8.0 (H) 08/09/2018   HGBA1C 8.0 (H) 09/13/2017   Lab Results  Component Value Date   MICROALBUR 65.3 (H) 10/31/2019   LDLCALC 159 (H) 10/31/2019   CREATININE 1.45 10/31/2019   Lab Results  Component Value Date   MICRALBCREAT 150.4 (H) 10/31/2019    Lab Results  Component Value Date   CHOL 231 (H) 10/31/2019   HDL 42.70 10/31/2019   LDLCALC 159 (H) 10/31/2019   TRIG 147.0 10/31/2019   CHOLHDL 5 10/31/2019        ASSESSMENT / PLAN / RECOMMENDATIONS:   1) Type 2 Diabetes Mellitus, Sub-Optimally controlled, With CKD III  complications - Most recent A1c of 8.4 %. Goal A1c < 7.0 %.    Plan: GENERAL: I have discussed with the patient the pathophysiology of diabetes. We went over the natural progression of the disease. We talked about both insulin resistance and insulin deficiency. We stressed the importance of lifestyle changes including diet and exercise. I explained the complications associated with diabetes including retinopathy, nephropathy, neuropathy as well as increased risk of cardiovascular disease. We went over the benefit seen with glycemic control.    I explained to the patient that diabetic patients are at higher than normal risk for amputations.   Pt has done so many lifestyle changes since using the freestyle libre, he has cut down on his carb intake and has noted improvement in his BG's. His average BG's on the CGM today is 104 mg/dL , I am not going to make any changes to his regimen at this time but I have encouraged him to confirm low Bg's on the CGM through finger stick as they are more accurate.   I have encouraged him to continue with lifestyle changes   MEDICATIONS: - Continue Metformin 500 mg, 1 tablet twice a day  - Continue Jardiance 10 mg daily    EDUCATION / INSTRUCTIONS:  BG monitoring instructions: Patient is instructed to check his blood sugars 2 times a day, fasting and bedtime . [name] I reviewed the Rule of 15 for the treatment of hypoglycemia in detail with the patient. Literature supplied.   2) Diabetic complications:   Eye: Does not  have known diabetic retinopathy.   Neuro/ Feet: Does not have known diabetic peripheral neuropathy.  Renal: Patient does  have known baseline CKD. He is on an ACEI/ARB at present.   3) Dyslipidemia : LDL above goal. Currently on Atorvastatin 20 mg daily . Will monitor         Signed electronically by: Mack Guise, MD  [facility] Endocrinology  [facility] [address] [place], [place]  [address] Phone: [phone] FAX: [phone]   CC: Cassandria Anger, MD [place] [address] Phone: [phone]  Fax: [phone]    Return to Endocrinology clinic as below: Future Appointments  Date Time Provider [place]  05/01/2020  2:00 PM Plotnikov, Evie Lacks, MD [facility] None

## 2020-03-04 ENCOUNTER — Ambulatory Visit: Payer: PRIVATE HEALTH INSURANCE | Admitting: Internal Medicine

## 2020-03-15 ENCOUNTER — Ambulatory Visit: Payer: PRIVATE HEALTH INSURANCE | Admitting: Internal Medicine

## 2020-03-18 ENCOUNTER — Ambulatory Visit (INDEPENDENT_AMBULATORY_CARE_PROVIDER_SITE_OTHER): Payer: PRIVATE HEALTH INSURANCE | Admitting: Internal Medicine

## 2020-03-18 ENCOUNTER — Other Ambulatory Visit: Payer: Self-pay

## 2020-03-18 VITALS — BP 116/78 | HR 85 | Ht 67.99 in | Wt 189.8 lb

## 2020-03-18 DIAGNOSIS — E1165 Type 2 diabetes mellitus with hyperglycemia: Secondary | ICD-10-CM | POA: Diagnosis not present

## 2020-03-18 LAB — POCT GLYCOSYLATED HEMOGLOBIN (HGB A1C): Hemoglobin A1C: 6.2 % — AB (ref 4.0–5.6)

## 2020-03-18 NOTE — Progress Notes (Signed)
Name: Troy Beck  Age/ Sex: 64 y.o., male   MRN/ DOB: [id number], 08/25/1955     PCP: Cassandria Anger, MD   Reason for Endocrinology Evaluation: Type 2 Diabetes Mellitus  Initial Endocrine Consultative Visit: 12/11/2019    PATIENT IDENTIFIER: Troy Beck is a 64 y.o. male with a past medical history of HTN, T2DM and Dyslipidemia. The patient has followed with Endocrinology clinic since 12/11/2019 for consultative assistance with management of his diabetes.  DIABETIC HISTORY:  Troy Beck was diagnosed with T2DM in 1995. He has been intermittently on metformin since his diagnosis. He was on Repaglinide at some point, no reported intolerance.   His hemoglobin A1c has ranged from 6.7% in 2012, peaking at 8.4% in 2021.    On his initial visit to our clinic he had an A1c of 8.4% . We did not make any changes as he had just made lifestyle changes  SUBJECTIVE:   During the last visit (12/11/2019): A1c 8.4% We continued Metformin and Jardiance   Today (03/18/2020): Mr. Lehrke is here for a follow up on diabetes management.  He has not been checking recently as [place]. The patient has not  had hypoglycemic episodes since the last clinic visit.   No recent UTI's  Denies nausea or diarrhea   HOME DIABETES REGIMEN:  Metformin 500 mg BID Jardiance 10 mg daily        Statin: yes ACE-I/ARB: yes    GLUCOSE METER:  One reading of 145 mg/dL      DIABETIC COMPLICATIONS: Microvascular complications:   CKD III  Denies: retinopathy, neuropathy  Last Eye Exam: Completed 09/2019  Macrovascular complications:    Denies: CAD, CVA, PVD   HISTORY:  Past Medical History:  Past Medical History:  Diagnosis Date  . Diabetes mellitus   . Hyperlipidemia   . Hypertension     Past Surgical History: No past surgical history on file.  Social History:  reports that he has never smoked. He has never used smokeless tobacco. He reports that he does  not drink alcohol and does not use drugs. Family History:  Family History  Problem Relation Age of Onset  . Heart disease Mother 27       ?aneurism, brain  . Heart disease Father 61       ?MI  . Hypertension Sister   . Stroke Brother      HOME MEDICATIONS: Allergies as of 03/18/2020      Reactions   Quinine Derivatives       Medication List       Accurate as of March 18, 2020 10:44 AM. If you have any questions, ask your nurse or doctor.        Accu-Chek Guide test strip Generic drug: glucose blood 2x daily   albuterol 108 (90 Base) MCG/ACT inhaler Commonly known as: VENTOLIN HFA Inhale 2 puffs into the lungs every 6 (six) hours as needed for wheezing or shortness of breath.   amLODipine 10 MG tablet Commonly known as: NORVASC Take 1 tablet (10 mg total) by mouth daily. Annual appt is w/labs must see provider for future refills   atorvastatin 20 MG tablet Commonly known as: LIPITOR Take 1 tablet (20 mg total) by mouth daily.   Breo Ellipta 100-25 MCG/INH Aepb Generic drug: fluticasone furoate-vilanterol INHALE 1 PUFF INTO THE LUNGS ONCE DAILY AS DIRECTED. *RINSE MOUTH AFTER USE*   brimonidine 0.2 % ophthalmic solution Commonly known as: ALPHAGAN 3 (three) times  daily.   dorzolamide 2 % ophthalmic solution Commonly known as: TRUSOPT 1 drop 3 (three) times daily.   doxazosin 4 MG tablet Commonly known as: CARDURA TAKE 1 TABLET (4 MG TOTAL) BY MOUTH DAILY. MUST SEE PROVIDER IN [facility] FOR FUTURE REFILLS   FreeStyle Libre 14 Day Reader Kerrin Mo Use to check blood sugar daily DX E11.69   FreeStyle Libre 14 Day Sensor Misc Use to check blood sugar daily DX E11.69   FreeStyle Libre Sensor System Misc Use to check blood sugar daily DX E11.69   Jardiance 10 MG Tabs tablet Generic drug: empagliflozin Take 10 mg by mouth daily.   latanoprost 0.005 % ophthalmic solution Commonly known as: XALATAN 1 drop at bedtime.   losartan 100 MG tablet Commonly known as:  COZAAR Take 0.5 tablets (50 mg total) by mouth daily.   metFORMIN 500 MG tablet Commonly known as: GLUCOPHAGE Take 1 tablet (500 mg total) by mouth 2 (two) times daily with a meal.   mometasone 50 MCG/ACT nasal spray Commonly known as: NASONEX Place 2 sprays into the nose daily.   naproxen 500 MG tablet Commonly known as: Naprosyn Take 1 tablet (500 mg total) by mouth 2 (two) times daily with a meal.   tadalafil 5 MG tablet Commonly known as: Cialis Take 1 tablet (5 mg total) by mouth daily.   triamcinolone cream 0.1 % Commonly known as: KENALOG Apply 1 application topically 3 (three) times daily.   Vitamin D3 50 MCG (2000 UT) capsule Take 1 capsule (2,000 Units total) by mouth daily.   Cholecalciferol 1.25 MG (50000 UT) capsule Commonly known as: D3-50 Take 1 capsule (50,000 Units total) by mouth every 30 (thirty) days.   zolpidem 10 MG tablet Commonly known as: AMBIEN TAKE ONE-HALF TABLET AT BEDTIME AS NEEDED FOR SLEEP *NEED OFFICE VISIT BEFORE REFILLS WILL BE GIVEN        OBJECTIVE:   Vital Signs: BP 116/78 (BP Location: Left Arm, Patient Position: Sitting)   Pulse 85   Ht 5' 7.99" (1.727 m)   Wt 189 lb 12.8 oz (86.1 kg)   SpO2 97%   BMI 28.87 kg/m   Wt Readings from Last 3 Encounters:  03/18/20 189 lb 12.8 oz (86.1 kg)  12/11/19 193 lb 3.2 oz (87.6 kg)  10/31/19 195 lb (88.5 kg)     Exam: General: Pt appears well and is in NAD  Lungs: Clear with good BS bilat with no rales, rhonchi, or wheezes   Heart: RRR with normal S1 and S2 and no gallops; no murmurs; no rub  Abdomen: Normoactive bowel sounds, soft, nontender, without masses or organomegaly palpable  Extremities: No pretibial edema.   Neuro: MS is good with appropriate affect, pt is alert and Ox3    DM foot exam: 12/11/2019 The skin of the feet is intact without sores or ulcerations. The pedal pulses are 2+ on right and 2+ on left. The sensation is intact to a screening 5.07, 10 gram  monofilament bilaterally  DATA REVIEWED:  Lab Results  Component Value Date   HGBA1C 6.2 (A) 03/18/2020   HGBA1C 6.2 03/18/2020   HGBA1C 6.2 03/18/2020   HGBA1C 6.2 03/18/2020   Lab Results  Component Value Date   MICROALBUR 65.3 (H) 10/31/2019   LDLCALC 159 (H) 10/31/2019   CREATININE 1.45 10/31/2019   Lab Results  Component Value Date   MICRALBCREAT 150.4 (H) 10/31/2019     Lab Results  Component Value Date   CHOL 231 (H) 10/31/2019  HDL 42.70 10/31/2019   LDLCALC 159 (H) 10/31/2019   TRIG 147.0 10/31/2019   CHOLHDL 5 10/31/2019         ASSESSMENT / PLAN / RECOMMENDATIONS:   1) Type 2 Diabetes Mellitus, Optimally controlled, With CKD III complications - Most recent A1c of 6.2 %. Goal A1c < 7.0 %.    - He continues to do well with lifestyle changes and medication compliance  - We will not be making any changes  , if his A1c remains optimal by next visit, will work on tapering off Metformin.  - We discussed cardiovascular benefits of Jardiance and of my preference to stay on this    MEDICATIONS: - Continue Metformin 500 mg, 1 tablet twice a day  - Continue Jardiance 10 mg daily    EDUCATION / INSTRUCTIONS:  BG monitoring instructions: Patient is instructed to check his blood sugars 1 times a day, fasting   Call [place] [facility] clinic if: BG persistently < 70 . I reviewed the Rule of 15 for the treatment of hypoglycemia in detail with the patient. Literature supplied.    2) Diabetic complications:   Eye: Does not have known diabetic retinopathy.   Neuro/ Feet: Does not have known diabetic peripheral neuropathy .   Renal: Patient does have known baseline CKD. He   is  on an ACEI/ARB at present.  3) Dyslipidemia : LDL above goal. Currently on Atorvastatin 20 mg daily . Will monitor   F/U in 6 months     Signed electronically by: Mack Guise, MD  [facility] Endocrinology  [facility] [address] [place], [place] [address] Phone: [phone] FAX: [phone]   CC: Cassandria Anger, MD [place] [address] Phone: [phone]  Fax: [phone]  Return to Endocrinology clinic as below: Future Appointments  Date Time Provider [place]  05/08/2020  2:00 PM Plotnikov, Evie Lacks, MD [facility] None

## 2020-03-18 NOTE — Patient Instructions (Signed)
-   Keep up the good work ! You have done an amazing work. - Continue Metformin 500 mg, 1 tablet twice a day  - Continue Jardiance 10 mg daily       HOW TO TREAT LOW BLOOD SUGARS (Blood sugar LESS THAN 70 MG/DL)  Please follow the RULE OF 15 for the treatment of hypoglycemia treatment (when your (blood sugars are less than 70 mg/dL)    STEP 1: Take 15 grams of carbohydrates when your blood sugar is low, which includes:   3-4 GLUCOSE TABS  OR  3-4 OZ OF JUICE OR REGULAR SODA OR  ONE TUBE OF GLUCOSE GEL     STEP 2: RECHECK blood sugar in 15 MINUTES STEP 3: If your blood sugar is still low at the 15 minute recheck --> then, go back to STEP 1 and treat AGAIN with another 15 grams of carbohydrates.

## 2020-05-01 ENCOUNTER — Ambulatory Visit: Payer: PRIVATE HEALTH INSURANCE | Admitting: Internal Medicine

## 2020-05-08 ENCOUNTER — Ambulatory Visit: Payer: PRIVATE HEALTH INSURANCE | Admitting: Internal Medicine

## 2020-05-21 ENCOUNTER — Other Ambulatory Visit: Payer: Self-pay

## 2020-05-21 ENCOUNTER — Ambulatory Visit (INDEPENDENT_AMBULATORY_CARE_PROVIDER_SITE_OTHER): Payer: PRIVATE HEALTH INSURANCE | Admitting: Internal Medicine

## 2020-05-21 ENCOUNTER — Encounter: Payer: Self-pay | Admitting: Internal Medicine

## 2020-05-21 VITALS — BP 122/80 | HR 64 | Temp 98.0°F | Ht 68.0 in | Wt 191.2 lb

## 2020-05-21 DIAGNOSIS — N1831 Chronic kidney disease, stage 3a: Secondary | ICD-10-CM

## 2020-05-21 DIAGNOSIS — E1122 Type 2 diabetes mellitus with diabetic chronic kidney disease: Secondary | ICD-10-CM | POA: Diagnosis not present

## 2020-05-21 DIAGNOSIS — N401 Enlarged prostate with lower urinary tract symptoms: Secondary | ICD-10-CM | POA: Diagnosis not present

## 2020-05-21 DIAGNOSIS — E785 Hyperlipidemia, unspecified: Secondary | ICD-10-CM

## 2020-05-21 DIAGNOSIS — R3911 Hesitancy of micturition: Secondary | ICD-10-CM

## 2020-05-21 DIAGNOSIS — Z1211 Encounter for screening for malignant neoplasm of colon: Secondary | ICD-10-CM | POA: Diagnosis not present

## 2020-05-21 DIAGNOSIS — I10 Essential (primary) hypertension: Secondary | ICD-10-CM

## 2020-05-21 LAB — COMPREHENSIVE METABOLIC PANEL
ALT: 14 U/L (ref 0–53)
AST: 16 U/L (ref 0–37)
Albumin: 4.1 g/dL (ref 3.5–5.2)
Alkaline Phosphatase: 63 U/L (ref 39–117)
BUN: 22 mg/dL (ref 6–23)
CO2: 29 mEq/L (ref 19–32)
Calcium: 9.6 mg/dL (ref 8.4–10.5)
Chloride: 101 mEq/L (ref 96–112)
Creatinine, Ser: 1.5 mg/dL (ref 0.40–1.50)
GFR: 48.47 mL/min — ABNORMAL LOW (ref >60.00)
Glucose, Bld: 126 mg/dL — ABNORMAL HIGH (ref 70–99)
Potassium: 4.3 mEq/L (ref 3.5–5.1)
Sodium: 136 mEq/L (ref 135–145)
Total Bilirubin: 0.6 mg/dL (ref 0.2–1.2)
Total Protein: 7.3 g/dL (ref 6.0–8.3)

## 2020-05-21 LAB — HEMOGLOBIN A1C: Hgb A1c MFr Bld: 7 % — ABNORMAL HIGH (ref 4.6–6.5)

## 2020-05-21 NOTE — Assessment & Plan Note (Signed)
Losartan HCT, Cardura, Norvasc

## 2020-05-21 NOTE — Assessment & Plan Note (Signed)
On Lipitor 

## 2020-05-21 NOTE — Assessment & Plan Note (Signed)
Labs

## 2020-05-21 NOTE — Progress Notes (Signed)
Subjective:  Patient ID: Troy Beck, male    DOB: Jul 11, 1956  Age: 64 y.o. MRN: [id number]  CC: Annual Exam   HPI Troy Beck presents for DM, HTN, dyslipidemia f/u  Outpatient Medications Prior to Visit  Medication Sig Dispense Refill  . albuterol (PROVENTIL HFA;VENTOLIN HFA) 108 (90 Base) MCG/ACT inhaler Inhale 2 puffs into the lungs every 6 (six) hours as needed for wheezing or shortness of breath. 3 Inhaler 3  . amLODipine (NORVASC) 10 MG tablet Take 1 tablet (10 mg total) by mouth daily. Annual appt is w/labs must see provider for future refills 90 tablet 3  . atorvastatin (LIPITOR) 20 MG tablet Take 1 tablet (20 mg total) by mouth daily. 90 tablet 3  . brimonidine (ALPHAGAN) 0.2 % ophthalmic solution 3 (three) times daily.    . Continuous Blood Gluc Receiver (FREESTYLE LIBRE 14 DAY READER) DEVI Use to check blood sugar daily DX E11.69 1 each 0  . Continuous Blood Gluc Sensor (FREESTYLE LIBRE 14 DAY SENSOR) MISC Use to check blood sugar daily DX E11.69 6 each 3  . Continuous Blood Gluc Sensor (FREESTYLE LIBRE SENSOR SYSTEM) MISC Use to check blood sugar daily DX E11.69 1 each 0  . dorzolamide (TRUSOPT) 2 % ophthalmic solution 1 drop 3 (three) times daily.    [name] doxazosin (CARDURA) 4 MG tablet TAKE 1 TABLET (4 MG TOTAL) BY MOUTH DAILY. MUST SEE PROVIDER IN [facility] FOR FUTURE REFILLS 90 tablet 3  . empagliflozin (JARDIANCE) 10 MG TABS tablet Take 10 mg by mouth daily. 90 tablet 1  . fluticasone furoate-vilanterol (BREO ELLIPTA) 100-25 MCG/INH AEPB INHALE 1 PUFF INTO THE LUNGS ONCE DAILY AS DIRECTED. *RINSE MOUTH AFTER USE* 3 each 3  . glucose blood (ACCU-CHEK GUIDE) test strip 2x daily 100 each 12  . latanoprost (XALATAN) 0.005 % ophthalmic solution 1 drop at bedtime.    [name] losartan (COZAAR) 100 MG tablet Take 0.5 tablets (50 mg total) by mouth daily. 90 tablet 3  . metFORMIN (GLUCOPHAGE) 500 MG tablet Take 1 tablet (500 mg total) by mouth 2 (two) times daily with a meal. 180  tablet 3  . mometasone (NASONEX) 50 MCG/ACT nasal spray Place 2 sprays into the nose daily.    . naproxen (NAPROSYN) 500 MG tablet Take 1 tablet (500 mg total) by mouth 2 (two) times daily with a meal. 60 tablet 3  . tadalafil (CIALIS) 5 MG tablet Take 1 tablet (5 mg total) by mouth daily. 90 tablet 3  . triamcinolone cream (KENALOG) 0.1 % Apply 1 application topically 3 (three) times daily. 80 g 1  . zolpidem (AMBIEN) 10 MG tablet TAKE ONE-HALF TABLET AT BEDTIME AS NEEDED FOR SLEEP *NEED OFFICE VISIT BEFORE REFILLS WILL BE GIVEN 45 tablet 1  . Cholecalciferol (VITAMIN D3) 2000 units capsule Take 1 capsule (2,000 Units total) by mouth daily. (Patient not taking: Reported on 12/11/2019) 100 capsule 3  . Cholecalciferol (D3-50) 50000 units capsule Take 1 capsule (50,000 Units total) by mouth every 30 (thirty) days. (Patient not taking: Reported on 12/11/2019) 3 capsule 3   No facility-administered medications prior to visit.    ROS: Review of Systems  Constitutional: Negative for appetite change, fatigue and unexpected weight change.  HENT: Negative for congestion, nosebleeds, sneezing, sore throat and trouble swallowing.   Eyes: Negative for itching and visual disturbance.  Respiratory: Negative for cough.   Cardiovascular: Negative for chest pain, palpitations and leg swelling.  Gastrointestinal: Negative for abdominal distention, blood in stool, diarrhea  and nausea.  Genitourinary: Negative for frequency and hematuria.  Musculoskeletal: Negative for back pain, gait problem, joint swelling and neck pain.  Skin: Negative for rash.  Neurological: Negative for dizziness, tremors, speech difficulty and weakness.  Psychiatric/Behavioral: Negative for agitation, dysphoric mood and sleep disturbance. The patient is not nervous/anxious.     Objective:  BP 122/80 (BP Location: Left Arm)   Pulse 64   Temp 98 F (36.7 C) (Oral)   Ht 5\' 8"  (1.727 m)   Wt 191 lb 3.2 oz (86.7 kg)   SpO2 98%   BMI  29.07 kg/m   BP Readings from Last 3 Encounters:  05/21/20 122/80  03/18/20 116/78  12/11/19 128/72    Wt Readings from Last 3 Encounters:  05/21/20 191 lb 3.2 oz (86.7 kg)  03/18/20 189 lb 12.8 oz (86.1 kg)  12/11/19 193 lb 3.2 oz (87.6 kg)    Physical Exam Constitutional:      General: He is not in acute distress.    Appearance: He is well-developed.     Comments: NAD  Eyes:     Conjunctiva/sclera: Conjunctivae normal.     Pupils: Pupils are equal, round, and reactive to light.  Neck:     Thyroid: No thyromegaly.     Vascular: No JVD.  Cardiovascular:     Rate and Rhythm: Normal rate and regular rhythm.     Heart sounds: Normal heart sounds. No murmur heard.  No friction rub. No gallop.   Pulmonary:     Effort: Pulmonary effort is normal. No respiratory distress.     Breath sounds: Normal breath sounds. No wheezing or rales.  Chest:     Chest wall: No tenderness.  Abdominal:     General: Bowel sounds are normal. There is no distension.     Palpations: Abdomen is soft. There is no mass.     Tenderness: There is no abdominal tenderness. There is no guarding or rebound.  Musculoskeletal:        General: No tenderness. Normal range of motion.     Cervical back: Normal range of motion.  Lymphadenopathy:     Cervical: No cervical adenopathy.  Skin:    General: Skin is warm and dry.     Findings: No rash.  Neurological:     Mental Status: He is alert and oriented to person, place, and time.     Cranial Nerves: No cranial nerve deficit.     Motor: No abnormal muscle tone.     Coordination: Coordination normal.     Gait: Gait normal.     Deep Tendon Reflexes: Reflexes are normal and symmetric.  Psychiatric:        Behavior: Behavior normal.        Thought Content: Thought content normal.        Judgment: Judgment normal.     Lab Results  Component Value Date   WBC 4.6 10/31/2019   HGB 15.6 10/31/2019   HCT 47.4 10/31/2019   PLT 169.0 10/31/2019   GLUCOSE  194 (H) 10/31/2019   CHOL 231 (H) 10/31/2019   TRIG 147.0 10/31/2019   HDL 42.70 10/31/2019   LDLCALC 159 (H) 10/31/2019   ALT 19 10/31/2019   AST 19 10/31/2019   NA 133 (L) 10/31/2019   K 4.5 10/31/2019   CL 101 10/31/2019   CREATININE 1.45 10/31/2019   BUN 27 (H) 10/31/2019   CO2 25 10/31/2019   TSH 1.10 10/31/2019   PSA 1.97 10/31/2019   HGBA1C 6.2 (  A) 03/18/2020   MICROALBUR 65.3 (H) 10/31/2019    CT Angio Chest W/Cm &/Or Wo Cm  Result Date: 08/25/2010 *RADIOLOGY REPORT* Clinical Data:  Chest pain and shortness of breath.  Productive cough. CT ANGIOGRAPHY CHEST WITH CONTRAST Technique:  Multidetector CT imaging of the chest was performed using the standard protocol during bolus administration of intravenous contrast.  Multiplanar CT image reconstructions including MIPs were obtained to evaluate the vascular anatomy. Contrast:  75 ml of Omnipaque-300 Comparison:  None. Findings:  There are no pulmonary emboli.  There are no infiltrates or effusions or mass lesions.  Heart size and vascularity are normal.  No adenopathy.  The visualized portion of the upper abdomen is normal. Review of the MIP images confirms the above findings. IMPRESSION: Normal exam.  Specifically, no evidence of pulmonary emboli or other acute abnormalities. Original Report Authenticated By: Larey Seat, M.D.   Assessment & Plan:      Troy Kehr, MD

## 2020-05-22 ENCOUNTER — Other Ambulatory Visit: Payer: Self-pay | Admitting: Internal Medicine

## 2020-05-25 ENCOUNTER — Other Ambulatory Visit: Payer: Self-pay | Admitting: Internal Medicine

## 2020-05-25 DIAGNOSIS — N183 Chronic kidney disease, stage 3 unspecified: Secondary | ICD-10-CM | POA: Insufficient documentation

## 2020-06-06 ENCOUNTER — Telehealth: Payer: Self-pay

## 2020-06-06 NOTE — Telephone Encounter (Signed)
As noted in the [employer] report, ES was awaiting self-Pay payment.  The patient has insurance which I verified during the call.  I sent ES a message to update the pt info to include verification of address so the patient may be sent a kit.   Pt aware & verb understanding.

## 2020-06-19 ENCOUNTER — Ambulatory Visit
Admission: RE | Admit: 2020-06-19 | Discharge: 2020-06-19 | Disposition: A | Discharge status: Home / Self Care | Payer: PRIVATE HEALTH INSURANCE | Source: Ambulatory Visit | Attending: Internal Medicine | Admitting: Internal Medicine

## 2020-06-19 DIAGNOSIS — N183 Chronic kidney disease, stage 3 unspecified: Secondary | ICD-10-CM

## 2020-08-06 ENCOUNTER — Other Ambulatory Visit: Payer: Self-pay | Admitting: Internal Medicine

## 2020-08-14 ENCOUNTER — Other Ambulatory Visit: Payer: Self-pay

## 2020-08-14 ENCOUNTER — Ambulatory Visit (AMBULATORY_SURGERY_CENTER): Payer: Self-pay

## 2020-08-14 VITALS — Ht 68.0 in | Wt 189.0 lb

## 2020-08-14 DIAGNOSIS — Z1211 Encounter for screening for malignant neoplasm of colon: Secondary | ICD-10-CM

## 2020-08-14 MED ORDER — PEG 3350-KCL-NA BICARB-NACL 420 G PO SOLR: 4000.0000 mL | Freq: Once | ORAL | 0 refills | Status: AC

## 2020-08-14 NOTE — Progress Notes (Signed)
No allergies to soy or egg Pt is not on blood thinners or diet pills Denies issues with sedation/intubation Denies atrial flutter/fib Denies constipation   Emmi instructions given to pt  Pt is aware of Covid safety and care partner requirements.   PT has not had a cologuard or colonoscopy

## 2020-08-15 ENCOUNTER — Encounter: Payer: Self-pay | Admitting: Gastroenterology

## 2020-08-19 ENCOUNTER — Ambulatory Visit: Payer: PRIVATE HEALTH INSURANCE | Admitting: Internal Medicine

## 2020-08-28 ENCOUNTER — Encounter: Payer: PRIVATE HEALTH INSURANCE | Admitting: Gastroenterology

## 2020-09-03 ENCOUNTER — Other Ambulatory Visit: Payer: Self-pay

## 2020-09-04 NOTE — Progress Notes (Signed)
Name: Troy Beck  Age/ Sex: 65 y.o., male   MRN/ DOB: [id number], [birth year]     PCP: Cassandria Anger, MD   Reason for Endocrinology Evaluation: Type 2 Diabetes Mellitus  Initial Endocrine Consultative Visit: 12/11/2019    PATIENT IDENTIFIER: Troy Beck is a 65 y.o. male with a past medical history of HTN, T2DM and Dyslipidemia. The patient has followed with Endocrinology clinic since 12/11/2019 for consultative assistance with management of his diabetes.  DIABETIC HISTORY:  [name] was diagnosed with T2DM in 1995. He has been intermittently on metformin since his diagnosis. He was on Repaglinide at some point, no reported intolerance.   His hemoglobin A1c has ranged from 6.7% in 2012, peaking at 8.4% in 2021.    On his initial visit to our clinic he had an A1c of 8.4% . We did not make any changes as he had just made lifestyle changes    He is a residential Veterinary surgeon SUBJECTIVE:   During the last visit (03/18/2020): A1c 6.2% We continued Metformin and Jardiance        Today (09/05/2020): Troy Beck is here for a follow up on diabetes management.  He has not been checking as he forgot his meter in [place]. The patient has not  had hypoglycemic episodes since the last clinic visit.   No recent UTI's  Denies nausea or diarrhea      HOME DIABETES REGIMEN:  Metformin 500 mg BID Jardiance 10 mg daily        Statin: yes ACE-I/ARB: yes    GLUCOSE METER:  One reading of 145 mg/dL      DIABETIC COMPLICATIONS: Microvascular complications:   CKD III  Denies: retinopathy, neuropathy  Last Eye Exam: Completed 09/2019  Macrovascular complications:    Denies: CAD, CVA, PVD   HISTORY:  Past Medical History:  Past Medical History:  Diagnosis Date  . Arthritis   . Asthma   . Cataract    cataract but not ready for surgery  . Diabetes mellitus   . Glaucoma   . Heart murmur    may have something but doctor is  monitoring it.  . Hyperlipidemia   . Hypertension   . Retinal detachment    Past Surgical History:  Past Surgical History:  Procedure Laterality Date  . EYE SURGERY    . FINGER SURGERY    . FOOT SURGERY      Social History:  reports that he has never smoked. He has never used smokeless tobacco. He reports current alcohol use. He reports that he does not use drugs. Family History:  Family History  Problem Relation Age of Onset  . Heart disease Mother 57       ?aneurism, brain  . Heart disease Father 79       ?MI  . Hypertension Sister   . Stroke Brother   . Colon cancer Neg Hx   . Colon polyps Neg Hx   . Esophageal cancer Neg Hx   . Rectal cancer Neg Hx   . Stomach cancer Neg Hx      HOME MEDICATIONS: Allergies as of 09/05/2020      Reactions   Quinine Derivatives       Medication List       Accurate as of September 05, 2020  8:20 AM. If you have any questions, ask your nurse or doctor.        STOP taking these medications   FreeStyle Libre 14 Day Reader Kerrin Mo  Stopped by: Dorita Sciara, MD   FreeStyle Libre 14 Day Sensor Misc Stopped by: Dorita Sciara, MD   FreeStyle Libre Sensor System Misc Stopped by: Dorita Sciara, MD     TAKE these medications   Accu-Chek Guide test strip Generic drug: glucose blood 2x daily   albuterol 108 (90 Base) MCG/ACT inhaler Commonly known as: VENTOLIN HFA Inhale 2 puffs into the lungs every 6 (six) hours as needed for wheezing or shortness of breath.   amLODipine 10 MG tablet Commonly known as: NORVASC Take 1 tablet (10 mg total) by mouth daily. Annual appt is w/labs must see provider for future refills   ASPIRIN PO Take by mouth.   atorvastatin 20 MG tablet Commonly known as: LIPITOR Take 1 tablet (20 mg total) by mouth daily.   Breo Ellipta 100-25 MCG/INH Aepb Generic drug: fluticasone furoate-vilanterol INHALE 1 PUFF INTO THE LUNGS ONCE DAILY AS DIRECTED. *RINSE MOUTH AFTER USE*    brimonidine 0.2 % ophthalmic solution Commonly known as: ALPHAGAN 3 (three) times daily.   dorzolamide 2 % ophthalmic solution Commonly known as: TRUSOPT 1 drop 3 (three) times daily.   doxazosin 4 MG tablet Commonly known as: CARDURA TAKE 1 TABLET (4 MG TOTAL) BY MOUTH DAILY. MUST SEE PROVIDER IN [facility] FOR FUTURE REFILLS   Jardiance 10 MG Tabs tablet Generic drug: empagliflozin TAKE 10 MG BY MOUTH DAILY.   latanoprost 0.005 % ophthalmic solution Commonly known as: XALATAN 1 drop at bedtime.   losartan 100 MG tablet Commonly known as: COZAAR Take 0.5 tablets (50 mg total) by mouth daily.   metFORMIN 500 MG tablet Commonly known as: GLUCOPHAGE TAKE 1 TABLET BY MOUTH TWICE A DAY   mometasone 50 MCG/ACT nasal spray Commonly known as: NASONEX Place 2 sprays into the nose daily.   naproxen 500 MG tablet Commonly known as: Naprosyn Take 1 tablet (500 mg total) by mouth 2 (two) times daily with a meal.   tadalafil 5 MG tablet Commonly known as: Cialis Take 1 tablet (5 mg total) by mouth daily.   triamcinolone 0.1 % Commonly known as: KENALOG Apply 1 application topically 3 (three) times daily.   Vitamin D3 50 MCG (2000 UT) capsule Take 1 capsule (2,000 Units total) by mouth daily.   zolpidem 10 MG tablet Commonly known as: AMBIEN TAKE ONE-HALF TABLET AT BEDTIME AS NEEDED FOR SLEEP *NEED OFFICE VISIT BEFORE REFILLS WILL BE GIVEN        OBJECTIVE:   Vital Signs: BP 130/72   Pulse 78   Ht 5\' 8"  (1.727 m)   Wt 191 lb 8 oz (86.9 kg)   SpO2 98%   BMI 29.12 kg/m   Wt Readings from Last 3 Encounters:  09/05/20 191 lb 8 oz (86.9 kg)  08/14/20 189 lb (85.7 kg)  05/21/20 191 lb 3.2 oz (86.7 kg)     Exam: General: Pt appears well and is in NAD  Lungs: Clear with good BS bilat with no rales, rhonchi, or wheezes   Heart: RRR with normal S1 and S2 and no gallops; no murmurs; no rub  Abdomen: Normoactive bowel sounds, soft, nontender, without masses or  organomegaly palpable  Extremities: No pretibial edema.   Neuro: MS is good with appropriate affect, pt is alert and Ox3    DM foot exam: 12/11/2019 The skin of the feet is intact without sores or ulcerations. The pedal pulses are 2+ on right and 2+ on left. The sensation is intact to a screening 5.07, 10  gram monofilament bilaterally  DATA REVIEWED:  Lab Results  Component Value Date   HGBA1C 6.5 (A) 09/05/2020   HGBA1C 7.0 (H) 05/21/2020   HGBA1C 6.2 (A) 03/18/2020  Results for TAYT, MOYERS (MRN [id number]) as of 09/06/2020 09:55  Ref. Range 09/05/2020 08:42  Sodium Latest Ref Range: 135 - 145 mEq/L 133 (L)  Potassium Latest Ref Range: 3.5 - 5.1 mEq/L 3.9  Chloride Latest Ref Range: 96 - 112 mEq/L 101  CO2 Latest Ref Range: 19 - 32 mEq/L 27  Glucose Latest Ref Range: 70 - 99 mg/dL 110 (H)  BUN Latest Ref Range: 6 - 23 mg/dL 30 (H)  Creatinine Latest Ref Range: 0.40 - 1.50 mg/dL 1.57 (H)  Calcium Latest Ref Range: 8.4 - 10.5 mg/dL 10.1  Alkaline Phosphatase Latest Ref Range: 39 - 117 U/L 63  Albumin Latest Ref Range: 3.5 - 5.2 g/dL 4.0  AST Latest Ref Range: 0 - 37 U/L 16  ALT Latest Ref Range: 0 - 53 U/L 14  Total Protein Latest Ref Range: 6.0 - 8.3 g/dL 7.4  Total Bilirubin Latest Ref Range: 0.2 - 1.2 mg/dL 0.9  GFR Latest Ref Range: >60.00 mL/min 45.79 (L)  Total CHOL/HDL Ratio Unknown 3  Cholesterol Latest Ref Range: 0 - 200 mg/dL 164  HDL Cholesterol Latest Ref Range: >39.00 mg/dL 50.70  LDL (calc) Latest Ref Range: 0 - 99 mg/dL 98  NonHDL Unknown 113.77  Triglycerides Latest Ref Range: 0.0 - 149.0 mg/dL 77.0  VLDL Latest Ref Range: 0.0 - 40.0 mg/dL 15.4    ASSESSMENT / PLAN / RECOMMENDATIONS:   1) Type 2 Diabetes Mellitus, Optimally controlled, With CKD III complications - Most recent A1c of 6.5 %. Goal A1c < 7.0 %.    - A1c continues to be at goal  - He is not interested in CGM , too much hassle while he travels. He was given an Accu-chek verio meter  - No  changes today   MEDICATIONS: - Continue Metformin 500 mg, 1 tablet twice a day  - Continue Jardiance 10 mg daily    EDUCATION / INSTRUCTIONS:  BG monitoring instructions: Patient is instructed to check his blood sugars 3 times a week  Call [facility] if: BG persistently < 70 . I reviewed the Rule of 15 for the treatment of hypoglycemia in detail with the patient. Literature supplied.    2) Diabetic complications:   Eye: Does not have known diabetic retinopathy.   Neuro/ Feet: Does not have known diabetic peripheral neuropathy .   Renal: Patient does have known baseline CKD, but has microalbuminuria (10/2019)  He   is  on an ACEI/ARB at present.  3) Dyslipidemia : LDL was high at 159 mg/dL  In 10/2019 . Repeat testing today showed improvement    Medication Continue Atorvastatin 20 mg daily    F/U in 6 months     Signed electronically by: Mack Guise, MD  [facility]  [address], [place] [address] Phone: [phone] FAX: [phone]   CC: Cassandria Anger, MD [place] [address] Phone: [phone]  Fax: [phone]  Return to Endocrinology clinic as below: Future Appointments  Date Time Provider [place]  09/17/2020  3:00 PM Ladene Artist, MD [facility]

## 2020-09-05 ENCOUNTER — Encounter: Payer: Self-pay | Admitting: Internal Medicine

## 2020-09-05 ENCOUNTER — Other Ambulatory Visit: Payer: Self-pay

## 2020-09-05 ENCOUNTER — Ambulatory Visit (INDEPENDENT_AMBULATORY_CARE_PROVIDER_SITE_OTHER): Payer: 59 | Admitting: Internal Medicine

## 2020-09-05 VITALS — BP 130/72 | HR 78 | Ht 68.0 in | Wt 191.5 lb

## 2020-09-05 DIAGNOSIS — E1122 Type 2 diabetes mellitus with diabetic chronic kidney disease: Secondary | ICD-10-CM | POA: Diagnosis not present

## 2020-09-05 DIAGNOSIS — E785 Hyperlipidemia, unspecified: Secondary | ICD-10-CM | POA: Diagnosis not present

## 2020-09-05 DIAGNOSIS — N1831 Chronic kidney disease, stage 3a: Secondary | ICD-10-CM

## 2020-09-05 DIAGNOSIS — E1129 Type 2 diabetes mellitus with other diabetic kidney complication: Secondary | ICD-10-CM | POA: Diagnosis not present

## 2020-09-05 DIAGNOSIS — R809 Proteinuria, unspecified: Secondary | ICD-10-CM

## 2020-09-05 LAB — LIPID PANEL
Cholesterol: 164 mg/dL (ref 0–200)
HDL: 50.7 mg/dL (ref >39.00)
LDL Cholesterol: 98 mg/dL (ref 0–99)
NonHDL: 113.77
Total CHOL/HDL Ratio: 3
Triglycerides: 77 mg/dL (ref 0.0–149.0)
VLDL: 15.4 mg/dL (ref 0.0–40.0)

## 2020-09-05 LAB — POCT GLYCOSYLATED HEMOGLOBIN (HGB A1C): Hemoglobin A1C: 6.5 % — AB (ref 4.0–5.6)

## 2020-09-05 LAB — COMPREHENSIVE METABOLIC PANEL
ALT: 14 U/L (ref 0–53)
AST: 16 U/L (ref 0–37)
Albumin: 4 g/dL (ref 3.5–5.2)
Alkaline Phosphatase: 63 U/L (ref 39–117)
BUN: 30 mg/dL — ABNORMAL HIGH (ref 6–23)
CO2: 27 mEq/L (ref 19–32)
Calcium: 10.1 mg/dL (ref 8.4–10.5)
Chloride: 101 mEq/L (ref 96–112)
Creatinine, Ser: 1.57 mg/dL — ABNORMAL HIGH (ref 0.40–1.50)
GFR: 45.79 mL/min — ABNORMAL LOW (ref >60.00)
Glucose, Bld: 110 mg/dL — ABNORMAL HIGH (ref 70–99)
Potassium: 3.9 mEq/L (ref 3.5–5.1)
Sodium: 133 mEq/L — ABNORMAL LOW (ref 135–145)
Total Bilirubin: 0.9 mg/dL (ref 0.2–1.2)
Total Protein: 7.4 g/dL (ref 6.0–8.3)

## 2020-09-05 LAB — POCT GLUCOSE (DEVICE FOR HOME USE): POC Glucose: 112 mg/dl — AB (ref 70–99)

## 2020-09-05 MED ORDER — METFORMIN HCL 500 MG PO TABS: 500.0000 mg | ORAL_TABLET | Freq: Two times a day (BID) | ORAL | 3 refills | Status: DC

## 2020-09-05 MED ORDER — ONETOUCH VERIO VI STRP: ORAL_STRIP | 12 refills | Status: DC

## 2020-09-05 MED ORDER — EMPAGLIFLOZIN 10 MG PO TABS: 10.0000 mg | ORAL_TABLET | Freq: Every day | ORAL | 3 refills | Status: DC

## 2020-09-05 NOTE — Patient Instructions (Signed)
-   Keep up the good work !  - Continue Metformin 500 mg, 1 tablet twice a day  - Continue Jardiance 10 mg daily       HOW TO TREAT LOW BLOOD SUGARS (Blood sugar LESS THAN 70 MG/DL)  Please follow the RULE OF 15 for the treatment of hypoglycemia treatment (when your (blood sugars are less than 70 mg/dL)    STEP 1: Take 15 grams of carbohydrates when your blood sugar is low, which includes:   3-4 GLUCOSE TABS  OR  3-4 OZ OF JUICE OR REGULAR SODA OR  ONE TUBE OF GLUCOSE GEL     STEP 2: RECHECK blood sugar in 15 MINUTES STEP 3: If your blood sugar is still low at the 15 minute recheck --> then, go back to STEP 1 and treat AGAIN with another 15 grams of carbohydrates.

## 2020-09-17 ENCOUNTER — Other Ambulatory Visit: Payer: Self-pay

## 2020-09-17 ENCOUNTER — Encounter: Payer: Self-pay | Admitting: Gastroenterology

## 2020-09-17 ENCOUNTER — Ambulatory Visit (AMBULATORY_SURGERY_CENTER): Payer: 59 | Admitting: Gastroenterology

## 2020-09-17 VITALS — BP 128/79 | HR 70 | Temp 98.7°F | Resp 16 | Ht 68.0 in | Wt 189.0 lb

## 2020-09-17 DIAGNOSIS — D120 Benign neoplasm of cecum: Secondary | ICD-10-CM

## 2020-09-17 DIAGNOSIS — D123 Benign neoplasm of transverse colon: Secondary | ICD-10-CM

## 2020-09-17 DIAGNOSIS — Z1211 Encounter for screening for malignant neoplasm of colon: Secondary | ICD-10-CM | POA: Diagnosis present

## 2020-09-17 MED ORDER — SODIUM CHLORIDE 0.9 % IV SOLN
500.0000 mL | Freq: Once | INTRAVENOUS | Status: DC
Start: 2020-09-17 — End: 2020-09-17

## 2020-09-17 NOTE — Progress Notes (Signed)
pt tolerated well. VSS. awake and to recovery. Report given to RN.  

## 2020-09-17 NOTE — Progress Notes (Signed)
Pt's states no medical or surgical changes since previsit or office visit. 

## 2020-09-17 NOTE — Progress Notes (Signed)
Called to room to assist during endoscopic procedure.  Patient ID and intended procedure confirmed with present staff. Received instructions for my participation in the procedure from the performing physician.  

## 2020-09-17 NOTE — Patient Instructions (Signed)
Await pathology  No Aspirin, Aspirin containing products (BC or Goody powders), Naproxen, NSAIDs (Ibuprofen, Advil, Aleve, Motrin) for 2 weeks- Tylenol is ok!!  Await pathology  Continue your normal medications  Please read over handouts about polyps and hemorrhoids  YOU HAD AN ENDOSCOPIC PROCEDURE TODAY AT [place]:   Refer to the procedure report that was given to you for any specific questions about what was found during the examination.  If the procedure report does not answer your questions, please call your gastroenterologist to clarify.  If you requested that your care partner not be given the details of your procedure findings, then the procedure report has been included in a sealed envelope for you to review at your convenience later.  YOU SHOULD EXPECT: Some feelings of bloating in the abdomen. Passage of more gas than usual.  Walking can help get rid of the air that was put into your GI tract during the procedure and reduce the bloating. If you had a lower endoscopy (such as a colonoscopy or flexible sigmoidoscopy) you may notice spotting of blood in your stool or on the toilet paper. If you underwent a bowel prep for your procedure, you may not have a normal bowel movement for a few days.  Please Note:  You might notice some irritation and congestion in your nose or some drainage.  This is from the oxygen used during your procedure.  There is no need for concern and it should clear up in a day or so.  SYMPTOMS TO REPORT IMMEDIATELY:   Following lower endoscopy (colonoscopy or flexible sigmoidoscopy):  Excessive amounts of blood in the stool  Significant tenderness or worsening of abdominal pains  Swelling of the abdomen that is new, acute  Fever of 100F or higher  For urgent or emergent issues, a gastroenterologist can be reached at any hour by calling [phone]. Do not use MyChart messaging for urgent concerns.    DIET:  We do recommend a small meal  at first, but then you may proceed to your regular diet.  Drink plenty of fluids but you should avoid alcoholic beverages for 24 hours.  ACTIVITY:  You should plan to take it easy for the rest of today and you should NOT DRIVE or use heavy machinery until tomorrow (because of the sedation medicines used during the test).    FOLLOW UP: Our staff will call the number listed on your records 48-72 hours following your procedure to check on you and address any questions or concerns that you may have regarding the information given to you following your procedure. If we do not reach you, we will leave a message.  We will attempt to reach you two times.  During this call, we will ask if you have developed any symptoms of COVID 19. If you develop any symptoms (ie: fever, flu-like symptoms, shortness of breath, cough etc.) before then, please call [phone].  If you test positive for Covid 19 in the 2 weeks post procedure, please call and report this information to [place].    If any biopsies were taken you will be contacted by phone or by letter within the next 1-3 weeks.  Please call us at [phone] if you have not heard about the biopsies in 3 weeks.    SIGNATURES/CONFIDENTIALITY: You and/or your care partner have signed paperwork which will be entered into your electronic medical record.  These signatures attest to the fact that that the information above on your After Visit  Summary has been reviewed and is understood.  Full responsibility of the confidentiality of this discharge information lies with you and/or your care-partner.

## 2020-09-17 NOTE — Progress Notes (Signed)
C.W. vital signs. 

## 2020-09-17 NOTE — Op Note (Signed)
[place] Patient Name: Troy Beck Procedure Date: 09/17/2020 2:44 PM MRN: [id number] Endoscopist: Ladene Artist , MD Age: 65 Referring MD:  Date of Birth: 26-Jan-1956 Gender: Male Account #: [id number] Procedure:                Colonoscopy Indications:              Screening for colorectal malignant neoplasm Medicines:                Monitored Anesthesia Care Procedure:                Pre-Anesthesia Assessment:                           - Prior to the procedure, a History and Physical                            was performed, and patient medications and                            allergies were reviewed. The patient's tolerance of                            previous anesthesia was also reviewed. The risks                            and benefits of the procedure and the sedation                            options and risks were discussed with the patient.                            All questions were answered, and informed consent                            was obtained. Prior Anticoagulants: The patient has                            taken no previous anticoagulant or antiplatelet                            agents. ASA Grade Assessment: III - A patient with                            severe systemic disease. After reviewing the risks                            and benefits, the patient was deemed in                            satisfactory condition to undergo the procedure.                           After obtaining informed consent, the colonoscope  was passed under direct vision. Throughout the                            procedure, the patient's blood pressure, pulse, and                            oxygen saturations were monitored continuously. The                            Olympus [id number]) [id number] was introduced                            through the anus and advanced to the the cecum,                            identified by  appendiceal orifice and ileocecal                            valve. The ileocecal valve, appendiceal orifice,                            and rectum were photographed. The quality of the                            bowel preparation was adequate. The colonoscopy was                            performed without difficulty. The patient tolerated                            the procedure well. Scope In: 3:05:05 PM Scope Out: 3:26:21 PM Scope Withdrawal Time: 0 hours 17 minutes 51 seconds  Total Procedure Duration: 0 hours 21 minutes 16 seconds  Findings:                 The perianal and digital rectal examinations were                            normal.                           A 10 mm polyp was found in the cecum. The polyp was                            semi-pedunculated. The polyp was removed with a                            cold snare. Resection and retrieval were complete.                           A 4 mm polyp was found in the transverse colon. The                            polyp was sessile. The polyp was removed with  a                            cold biopsy forceps. Resection and retrieval were                            complete.                           A 7 mm polyp was found in the transverse colon. The                            polyp was semi-pedunculated. The polyp was removed                            with a cold snare. Resection and retrieval were                            complete.                           External hemorrhoids were found during                            retroflexion. The hemorrhoids were moderate.                           The exam was otherwise without abnormality on                            direct and retroflexion views. Complications:            No immediate complications. Estimated blood loss:                            None. Estimated Blood Loss:     Estimated blood loss: none. Impression:               - One 10 mm polyp in the cecum, removed  with a cold                            snare. Resected and retrieved.                           - One 4 mm polyp in the transverse colon, removed                            with a cold biopsy forceps. Resected and retrieved.                           - One 7 mm polyp in the transverse colon, removed                            with a cold snare. Resected and retrieved.                           -  External hemorrhoids.                           - The examination was otherwise normal on direct                            and retroflexion views. Recommendation:           - Repeat colonoscopy date to be determined after                            pending pathology results are reviewed for                            surveillance based on pathology results.                           - Patient has a contact number available for                            emergencies. The signs and symptoms of potential                            delayed complications were discussed with the                            patient. Return to normal activities tomorrow.                            Written discharge instructions were provided to the                            patient.                           - Resume previous diet.                           - Continue present medications.                           - Await pathology results.                           - No aspirin, ibuprofen, naproxen, or other                            non-steroidal anti-inflammatory drugs for 2 weeks                            after polyp removal. Ladene Artist, MD 09/17/2020 3:30:58 PM This report has been signed electronically.

## 2020-09-19 ENCOUNTER — Telehealth: Payer: Self-pay

## 2020-09-19 NOTE — Telephone Encounter (Signed)
  Follow up Call-  Call back number 09/17/2020  Post procedure Call Back phone  # [phone]  Permission to leave phone message Yes  Some recent data might be hidden     Patient questions:  Do you have a fever, pain , or abdominal swelling? No. Pain Score  0 *  Have you tolerated food without any problems? Yes.    Have you been able to return to your normal activities? Yes.    Do you have any questions about your discharge instructions: Diet   No. Medications  No. Follow up visit  No.  Do you have questions or concerns about your Care? No.  Actions: * If pain score is 4 or above: No action needed, pain <4. 1. Have you developed a fever since your procedure? no  2.   Have you had an respiratory symptoms (SOB or cough) since your procedure? no  3.   Have you tested positive for COVID 19 since your procedure no  4.   Have you had any family members/close contacts diagnosed with the COVID 19 since your procedure?  no   If yes to any of these questions please route to Joylene John, RN and Joella Prince, RN

## 2020-09-25 ENCOUNTER — Encounter: Payer: Self-pay | Admitting: Gastroenterology

## 2020-10-17 ENCOUNTER — Other Ambulatory Visit: Payer: Self-pay | Admitting: Internal Medicine

## 2020-11-05 ENCOUNTER — Other Ambulatory Visit: Payer: Self-pay | Admitting: Internal Medicine

## 2020-12-21 ENCOUNTER — Other Ambulatory Visit: Payer: Self-pay | Admitting: Internal Medicine

## 2021-01-19 ENCOUNTER — Other Ambulatory Visit: Payer: Self-pay | Admitting: Internal Medicine

## 2021-02-03 ENCOUNTER — Encounter: Payer: Self-pay | Admitting: Internal Medicine

## 2021-02-03 ENCOUNTER — Other Ambulatory Visit: Payer: Self-pay

## 2021-02-03 ENCOUNTER — Ambulatory Visit (INDEPENDENT_AMBULATORY_CARE_PROVIDER_SITE_OTHER): Payer: 59 | Admitting: Internal Medicine

## 2021-02-03 VITALS — BP 141/90 | Temp 98.4°F | Ht 68.0 in | Wt 189.4 lb

## 2021-02-03 DIAGNOSIS — I10 Essential (primary) hypertension: Secondary | ICD-10-CM

## 2021-02-03 DIAGNOSIS — G4700 Insomnia, unspecified: Secondary | ICD-10-CM

## 2021-02-03 DIAGNOSIS — E1122 Type 2 diabetes mellitus with diabetic chronic kidney disease: Secondary | ICD-10-CM | POA: Diagnosis not present

## 2021-02-03 DIAGNOSIS — Z0000 Encounter for general adult medical examination without abnormal findings: Secondary | ICD-10-CM | POA: Diagnosis not present

## 2021-02-03 DIAGNOSIS — N1831 Chronic kidney disease, stage 3a: Secondary | ICD-10-CM | POA: Diagnosis not present

## 2021-02-03 DIAGNOSIS — N183 Chronic kidney disease, stage 3 unspecified: Secondary | ICD-10-CM

## 2021-02-03 DIAGNOSIS — N2889 Other specified disorders of kidney and ureter: Secondary | ICD-10-CM

## 2021-02-03 LAB — COMPREHENSIVE METABOLIC PANEL
ALT: 14 U/L (ref 0–53)
AST: 17 U/L (ref 0–37)
Albumin: 4.1 g/dL (ref 3.5–5.2)
Alkaline Phosphatase: 64 U/L (ref 39–117)
BUN: 20 mg/dL (ref 6–23)
CO2: 27 mEq/L (ref 19–32)
Calcium: 9.6 mg/dL (ref 8.4–10.5)
Chloride: 99 mEq/L (ref 96–112)
Creatinine, Ser: 1.73 mg/dL — ABNORMAL HIGH (ref 0.40–1.50)
GFR: 40.64 mL/min — ABNORMAL LOW (ref >60.00)
Glucose, Bld: 204 mg/dL — ABNORMAL HIGH (ref 70–99)
Potassium: 4.4 mEq/L (ref 3.5–5.1)
Sodium: 134 mEq/L — ABNORMAL LOW (ref 135–145)
Total Bilirubin: 0.7 mg/dL (ref 0.2–1.2)
Total Protein: 6.8 g/dL (ref 6.0–8.3)

## 2021-02-03 LAB — URINALYSIS, ROUTINE W REFLEX MICROSCOPIC
Bilirubin Urine: NEGATIVE
Hgb urine dipstick: NEGATIVE
Ketones, ur: NEGATIVE
Leukocytes,Ua: NEGATIVE
Nitrite: NEGATIVE
RBC / HPF: NONE SEEN (ref 0–?)
Specific Gravity, Urine: 1.01 (ref 1.000–1.030)
Total Protein, Urine: 100 — AB
Urine Glucose: >=1000 — AB
Urobilinogen, UA: 0.2 (ref 0.0–1.0)
WBC, UA: NONE SEEN (ref 0–?)
pH: 6.5 (ref 5.0–8.0)

## 2021-02-03 LAB — CBC WITH DIFFERENTIAL/PLATELET
Basophils Absolute: 0.1 10*3/uL (ref 0.0–0.1)
Basophils Relative: 1.7 % (ref 0.0–3.0)
Eosinophils Absolute: 0.2 10*3/uL (ref 0.0–0.7)
Eosinophils Relative: 3.1 % (ref 0.0–5.0)
HCT: 45.6 % (ref 39.0–52.0)
Hemoglobin: 15.3 g/dL (ref 13.0–17.0)
Lymphocytes Relative: 37.5 % (ref 12.0–46.0)
Lymphs Abs: 1.8 10*3/uL (ref 0.7–4.0)
MCHC: 33.6 g/dL (ref 30.0–36.0)
MCV: 86.8 fl (ref 78.0–100.0)
Monocytes Absolute: 0.5 10*3/uL (ref 0.1–1.0)
Monocytes Relative: 11.1 % (ref 3.0–12.0)
Neutro Abs: 2.3 10*3/uL (ref 1.4–7.7)
Neutrophils Relative %: 46.6 % (ref 43.0–77.0)
Platelets: 202 10*3/uL (ref 150.0–400.0)
RBC: 5.25 Mil/uL (ref 4.22–5.81)
RDW: 13.9 % (ref 11.5–15.5)
WBC: 4.8 10*3/uL (ref 4.0–10.5)

## 2021-02-03 LAB — LIPID PANEL
Cholesterol: 149 mg/dL (ref 0–200)
HDL: 46.4 mg/dL (ref >39.00)
LDL Cholesterol: 80 mg/dL (ref 0–99)
NonHDL: 102.61
Total CHOL/HDL Ratio: 3
Triglycerides: 113 mg/dL (ref 0.0–149.0)
VLDL: 22.6 mg/dL (ref 0.0–40.0)

## 2021-02-03 LAB — MICROALBUMIN / CREATININE URINE RATIO
Creatinine,U: 56 mg/dL
Microalb Creat Ratio: 120 mg/g — ABNORMAL HIGH (ref 0.0–30.0)
Microalb, Ur: 67.2 mg/dL — ABNORMAL HIGH (ref 0.0–1.9)

## 2021-02-03 LAB — PSA: PSA: 2.35 ng/mL (ref 0.10–4.00)

## 2021-02-03 LAB — TSH: TSH: 1.42 u[IU]/mL (ref 0.35–5.50)

## 2021-02-03 LAB — HEMOGLOBIN A1C: Hgb A1c MFr Bld: 7.1 % — ABNORMAL HIGH (ref 4.6–6.5)

## 2021-02-03 MED ORDER — ATORVASTATIN CALCIUM 20 MG PO TABS: 20.0000 mg | ORAL_TABLET | Freq: Every day | ORAL | 3 refills | Status: DC

## 2021-02-03 MED ORDER — AMLODIPINE BESYLATE 10 MG PO TABS: 10.0000 mg | ORAL_TABLET | Freq: Every day | ORAL | 3 refills | Status: DC

## 2021-02-03 MED ORDER — DOXAZOSIN MESYLATE 4 MG PO TABS: 4.0000 mg | ORAL_TABLET | Freq: Every day | ORAL | 3 refills | Status: DC

## 2021-02-03 MED ORDER — LOSARTAN POTASSIUM 100 MG PO TABS: 100.0000 mg | ORAL_TABLET | Freq: Every day | ORAL | 3 refills | Status: AC

## 2021-02-03 NOTE — Assessment & Plan Note (Signed)
Losartan HCT, Cardura, Norvasc

## 2021-02-03 NOTE — Progress Notes (Signed)
Subjective:  Patient ID: Troy Beck, male    DOB: [birth year]  Age: 65 y.o. MRN: [id number]  CC: Annual Exam   HPI Troy Beck presents for a well exam F/u on DM, HTN  Outpatient Medications Prior to Visit  Medication Sig Dispense Refill   amLODipine (NORVASC) 10 MG tablet TAKE 1 TABLET (10 MG TOTAL) BY MOUTH DAILY. ANNUAL APPT IS W/LABS MUST SEE PROVIDER FOR FUTURE REFILLS 90 tablet 3   ASPIRIN PO Take by mouth.     atorvastatin (LIPITOR) 20 MG tablet Take 1 tablet (20 mg total) by mouth daily. 30 tablet 11   brimonidine (ALPHAGAN) 0.2 % ophthalmic solution 3 (three) times daily.     Cholecalciferol (VITAMIN D3) 2000 units capsule Take 1 capsule (2,000 Units total) by mouth daily. 100 capsule 3   dorzolamide (TRUSOPT) 2 % ophthalmic solution 1 drop 3 (three) times daily.     doxazosin (CARDURA) 4 MG tablet Take 1 tablet (4 mg total) by mouth daily. TAKE 1 TABLET (4 MG TOTAL) BY MOUTH DAILY. Overdue for annual appt must see provider for refills 30 tablet 11   empagliflozin (JARDIANCE) 10 MG TABS tablet Take 1 tablet (10 mg total) by mouth daily. 90 tablet 3   fluticasone furoate-vilanterol (BREO ELLIPTA) 100-25 MCG/INH AEPB INHALE 1 PUFF INTO THE LUNGS ONCE DAILY AS DIRECTED. *RINSE MOUTH AFTER USE* 3 each 3   glucose blood (ONETOUCH VERIO) test strip Daily 50 each 12   latanoprost (XALATAN) 0.005 % ophthalmic solution 1 drop at bedtime.     losartan (COZAAR) 100 MG tablet TAKE 1/2 TABLET BY MOUTH DAILY 15 tablet 0   metFORMIN (GLUCOPHAGE) 500 MG tablet Take 1 tablet (500 mg total) by mouth 2 (two) times daily. 180 tablet 3   tadalafil (CIALIS) 5 MG tablet Take 1 tablet (5 mg total) by mouth daily. 90 tablet 3   zolpidem (AMBIEN) 10 MG tablet TAKE ONE-HALF TABLET AT BEDTIME AS NEEDED FOR SLEEP *NEED OFFICE VISIT BEFORE REFILLS WILL BE GIVEN 45 tablet 1   albuterol (PROVENTIL HFA;VENTOLIN HFA) 108 (90 Base) MCG/ACT inhaler Inhale 2 puffs into the lungs every 6 (six) hours as needed  for wheezing or shortness of breath. (Patient not taking: Reported on 02/03/2021) 3 Inhaler 3   mometasone (NASONEX) 50 MCG/ACT nasal spray Place 2 sprays into the nose daily. (Patient not taking: Reported on 02/03/2021)     naproxen (NAPROSYN) 500 MG tablet Take 1 tablet (500 mg total) by mouth 2 (two) times daily with a meal. (Patient not taking: Reported on 02/03/2021) 60 tablet 3   triamcinolone cream (KENALOG) 0.1 % Apply 1 application topically 3 (three) times daily. (Patient not taking: Reported on 02/03/2021) 80 g 1   No facility-administered medications prior to visit.    ROS: Review of Systems  Constitutional:  Negative for appetite change, fatigue and unexpected weight change.  HENT:  Negative for congestion, nosebleeds, sneezing, sore throat and trouble swallowing.   Eyes:  Negative for itching and visual disturbance.  Respiratory:  Negative for cough.   Cardiovascular:  Negative for chest pain, palpitations and leg swelling.  Gastrointestinal:  Negative for abdominal distention, blood in stool, diarrhea and nausea.  Genitourinary:  Negative for frequency and hematuria.  Musculoskeletal:  Negative for back pain, gait problem, joint swelling and neck pain.  Skin:  Negative for rash.  Neurological:  Negative for dizziness, tremors, speech difficulty and weakness.  Psychiatric/Behavioral:  Negative for agitation, dysphoric mood and sleep disturbance. The patient is not  nervous/anxious.    Objective:  BP (!) 141/90 (BP Location: Left Arm)   Temp 98.4 F (36.9 C) (Oral)   Ht 5\' 8"  (1.727 m)   Wt 189 lb 6.4 oz (85.9 kg)   SpO2 97%   BMI 28.80 kg/m   BP Readings from Last 3 Encounters:  02/03/21 (!) 141/90  09/17/20 128/79  09/05/20 130/72    Wt Readings from Last 3 Encounters:  02/03/21 189 lb 6.4 oz (85.9 kg)  09/17/20 189 lb (85.7 kg)  09/05/20 191 lb 8 oz (86.9 kg)    Physical Exam Constitutional:      General: He is not in acute distress.    Appearance: He is  well-developed.     Comments: NAD  Eyes:     Conjunctiva/sclera: Conjunctivae normal.     Pupils: Pupils are equal, round, and reactive to light.  Neck:     Thyroid: No thyromegaly.     Vascular: No JVD.  Cardiovascular:     Rate and Rhythm: Normal rate and regular rhythm.     Heart sounds: Normal heart sounds. No murmur heard.   No friction rub. No gallop.  Pulmonary:     Effort: Pulmonary effort is normal. No respiratory distress.     Breath sounds: Normal breath sounds. No wheezing or rales.  Chest:     Chest wall: No tenderness.  Abdominal:     General: Bowel sounds are normal. There is no distension.     Palpations: Abdomen is soft. There is no mass.     Tenderness: There is no abdominal tenderness. There is no guarding or rebound.  Genitourinary:    Prostate: Normal.     Rectum: Normal. Guaiac result negative.  Musculoskeletal:        General: No tenderness. Normal range of motion.     Cervical back: Normal range of motion.  Lymphadenopathy:     Cervical: No cervical adenopathy.  Skin:    General: Skin is warm and dry.     Findings: No rash.  Neurological:     Mental Status: He is alert and oriented to person, place, and time.     Cranial Nerves: No cranial nerve deficit.     Motor: No abnormal muscle tone.     Coordination: Coordination normal.     Gait: Gait normal.     Deep Tendon Reflexes: Reflexes are normal and symmetric.  Psychiatric:        Behavior: Behavior normal.        Thought Content: Thought content normal.        Judgment: Judgment normal.    Lab Results  Component Value Date   WBC 4.6 10/31/2019   HGB 15.6 10/31/2019   HCT 47.4 10/31/2019   PLT 169.0 10/31/2019   GLUCOSE 110 (H) 09/05/2020   CHOL 164 09/05/2020   TRIG 77.0 09/05/2020   HDL 50.70 09/05/2020   LDLCALC 98 09/05/2020   ALT 14 09/05/2020   AST 16 09/05/2020   NA 133 (L) 09/05/2020   K 3.9 09/05/2020   CL 101 09/05/2020   CREATININE 1.57 (H) 09/05/2020   BUN 30 (H)  09/05/2020   CO2 27 09/05/2020   TSH 1.10 10/31/2019   PSA 1.97 10/31/2019   HGBA1C 6.5 (A) 09/05/2020   MICROALBUR 65.3 (H) 10/31/2019    US Renal  Result Date: 06/20/2020 CLINICAL DATA:  Decreased GFR EXAM: RENAL / URINARY TRACT ULTRASOUND COMPLETE COMPARISON:  CT chest 08/25/2010 FINDINGS: Right Kidney: Renal measurements: 11.8 x 5.4  x 5.6 cm = volume: 185 mL. Renal cortical echogenicity is mildly increased. Multiple anechoic simple appearing cysts are present throughout the kidney including cortical and parapelvic cysts. Largest measures up to 1.6 cm in size. No concerning cyst features. No discrete concerning renal mass. No visible shadowing nephrolith or hydronephrosis. Left Kidney: Renal measurements: 11.7 x 5.2 x 5.8 cm = volume: 182 mL. Renal cortical echogenicity is mildly increased. There are multiple anechoic simple appearing cysts throughout the kidney including a combination of cortical and parapelvic cysts. A slightly larger 2.4 cm cyst in the left kidney demonstrates some thin internal septation peripheral echogenicity which could reflect a mural calcification or layering milk of calcium. No other acute or worrisome renal masses, shadowing calculus or hydronephrosis. Bladder: Incompletely distended at the time of exam. Unremarkable for degree of distension. Other: None. IMPRESSION: 1. Increased cortical echogenicity bilaterally may reflect medical renal disease. 2. Multiple simple appearing cysts throughout both kidneys including a combination of cortical and parapelvic cysts 3. Slightly larger 2.4 cm cyst in the upper pole left kidney demonstrating a thin internal septation and peripheral echogenicity. These are not particularly worrisome features though could consider six-month follow-up ultrasound to assess for stability or as clinically warranted. Electronically Signed   By: Lovena Le M.D.   On: 06/20/2020 00:09    Assessment & Plan:     Walker Kehr, MD

## 2021-02-03 NOTE — Assessment & Plan Note (Signed)
Cont w/Zolpidem prn  Potential benefits of a long term benzodiazepines  use as well as potential risks  and complications were explained to the patient and were aknowledged. 

## 2021-02-03 NOTE — Assessment & Plan Note (Addendum)
Good hydration Nephrol ref  2021 abd US IMPRESSION: 1. Increased cortical echogenicity bilaterally may reflect medical renal disease. 2. Multiple simple appearing cysts throughout both kidneys including a combination of cortical and parapelvic cysts 3. Slightly larger 2.4 cm cyst in the upper pole left kidney demonstrating a thin internal septation and peripheral echogenicity. These are not particularly worrisome features though could consider six-month follow-up ultrasound to assess for stability or as clinically warranted.

## 2021-02-03 NOTE — Assessment & Plan Note (Signed)
Obtain A1c, CMET

## 2021-02-03 NOTE — Patient Instructions (Signed)

## 2021-02-03 NOTE — Assessment & Plan Note (Signed)

## 2021-03-06 ENCOUNTER — Ambulatory Visit: Payer: 59 | Admitting: Internal Medicine

## 2021-04-07 ENCOUNTER — Other Ambulatory Visit: Payer: Self-pay | Admitting: Internal Medicine

## 2021-04-28 ENCOUNTER — Other Ambulatory Visit: Payer: Self-pay

## 2021-04-28 ENCOUNTER — Encounter: Payer: Self-pay | Admitting: Internal Medicine

## 2021-04-28 ENCOUNTER — Ambulatory Visit (INDEPENDENT_AMBULATORY_CARE_PROVIDER_SITE_OTHER): Payer: 59 | Admitting: Internal Medicine

## 2021-04-28 VITALS — BP 144/90 | HR 85 | Ht 68.0 in | Wt 191.0 lb

## 2021-04-28 DIAGNOSIS — E1122 Type 2 diabetes mellitus with diabetic chronic kidney disease: Secondary | ICD-10-CM | POA: Diagnosis not present

## 2021-04-28 DIAGNOSIS — N1831 Chronic kidney disease, stage 3a: Secondary | ICD-10-CM | POA: Diagnosis not present

## 2021-04-28 LAB — GLUCOSE, POCT (MANUAL RESULT ENTRY): POC Glucose: 98 mg/dl (ref 70–99)

## 2021-04-28 LAB — POCT GLYCOSYLATED HEMOGLOBIN (HGB A1C): Hemoglobin A1C: 7 % — AB (ref 4.0–5.6)

## 2021-04-28 MED ORDER — EMPAGLIFLOZIN 25 MG PO TABS: 25.0000 mg | ORAL_TABLET | Freq: Every day | ORAL | 3 refills | Status: DC

## 2021-04-28 MED ORDER — METFORMIN HCL ER 500 MG PO TB24: 1000.0000 mg | ORAL_TABLET | Freq: Every day | ORAL | 3 refills | Status: DC

## 2021-04-28 MED ORDER — ONETOUCH VERIO VI STRP: 1.0000 | ORAL_STRIP | Freq: Every day | 3 refills | Status: DC

## 2021-04-28 NOTE — Patient Instructions (Signed)
-   Continue Metformin 500 mg, Two tablets a day  - Increase Jardiance to 25 mg daily      HOW TO TREAT LOW BLOOD SUGARS (Blood sugar LESS THAN 70 MG/DL) Please follow the RULE OF 15 for the treatment of hypoglycemia treatment (when your (blood sugars are less than 70 mg/dL)   STEP 1: Take 15 grams of carbohydrates when your blood sugar is low, which includes:  3-4 GLUCOSE TABS  OR 3-4 OZ OF JUICE OR REGULAR SODA OR ONE TUBE OF GLUCOSE GEL    STEP 2: RECHECK blood sugar in 15 MINUTES STEP 3: If your blood sugar is still low at the 15 minute recheck --> then, go back to STEP 1 and treat AGAIN with another 15 grams of carbohydrates.

## 2021-04-28 NOTE — Progress Notes (Signed)
Name: Troy Beck  Age/ Sex: 65 y.o., male   MRN/ DOB: [id number], [birth year]     PCP: Cassandria Anger, MD   Reason for Endocrinology Evaluation: Type 2 Diabetes Mellitus  Initial Endocrine Consultative Visit: 12/11/2019    PATIENT IDENTIFIER: [name] is a 65 y.o. male with a past medical history of HTN, T2DM and Dyslipidemia. The patient has followed with Endocrinology clinic since 12/11/2019 for consultative assistance with management of his diabetes.  DIABETIC HISTORY:  Troy Beck was diagnosed with T2DM in 1995. He has been intermittently on metformin since his diagnosis. He was on Repaglinide at some point, no reported intolerance.   His hemoglobin A1c has ranged from 6.7% in 2012, peaking at 8.4% in 2021.    On his initial visit to our clinic he had an A1c of 8.4% . We did not make any changes as he had just made lifestyle changes    He is a residential Veterinary surgeon SUBJECTIVE:   During the last visit (09/05/2020): A1c 6.5% We continued Metformin and Jardiance        Today (04/28/2021): [name] is here for a follow up on diabetes management.  He has not been checking glucose at home.  He continues to travel for his [place], he is doing well with low carb diet despite travel.    No recent UTI's  Denies nausea or diarrhea      HOME DIABETES REGIMEN:  Metformin 500 mg BID Jardiance 10 mg daily        Statin: yes ACE-I/ARB: yes    GLUCOSE METER: N/A     DIABETIC COMPLICATIONS: Microvascular complications:  CKD III Denies: retinopathy, neuropathy Last Eye Exam: Completed 09/2019  Macrovascular complications:   Denies: CAD, CVA, PVD   HISTORY:  Past Medical History:  Past Medical History:  Diagnosis Date   Arthritis    Asthma    Cataract    cataract but not ready for surgery   Diabetes mellitus    Glaucoma    Heart murmur    may have something but doctor is monitoring it.    Hyperlipidemia    Hypertension    Retinal detachment    Past Surgical History:  Past Surgical History:  Procedure Laterality Date   EYE SURGERY     FINGER SURGERY     FOOT SURGERY     Social History:  reports that he has never smoked. He has never used smokeless tobacco. He reports current alcohol use. He reports that he does not use drugs. Family History:  Family History  Problem Relation Age of Onset   Heart disease Mother 51       ?aneurism, brain   Heart disease Father 36       ?MI   Hypertension Sister    Stroke Brother    Colon cancer Neg Hx    Colon polyps Neg Hx    Esophageal cancer Neg Hx    Rectal cancer Neg Hx    Stomach cancer Neg Hx      HOME MEDICATIONS: Allergies as of 04/28/2021       Reactions   Quinine Derivatives         Medication List        Accurate as of April 28, 2021  3:39 PM. If you have any questions, ask your nurse or doctor.          STOP taking these medications    metFORMIN 500 MG tablet Commonly known as:  GLUCOPHAGE Replaced by: metFORMIN 500 MG 24 hr tablet Stopped by: Dorita Sciara, MD       TAKE these medications    amLODipine 10 MG tablet Commonly known as: NORVASC Take 1 tablet (10 mg total) by mouth daily.   ASPIRIN PO Take by mouth.   atorvastatin 20 MG tablet Commonly known as: LIPITOR Take 1 tablet (20 mg total) by mouth daily.   brimonidine 0.2 % ophthalmic solution Commonly known as: ALPHAGAN 3 (three) times daily.   dorzolamide 2 % ophthalmic solution Commonly known as: TRUSOPT 1 drop 3 (three) times daily.   doxazosin 4 MG tablet Commonly known as: CARDURA Take 1 tablet (4 mg total) by mouth daily. TAKE 1 TABLET (4 MG TOTAL) BY MOUTH DAILY.   empagliflozin 25 MG Tabs tablet Commonly known as: Jardiance Take 1 tablet (25 mg total) by mouth daily before breakfast. What changed:  medication strength how much to take when to take this Changed by: Dorita Sciara, MD    fluticasone furoate-vilanterol 100-25 MCG/INH Aepb Commonly known as: Breo Ellipta INHALE 1 PUFF INTO THE LUNGS ONCE DAILY AS DIRECTED - RINSE MOUTH AFTER USE   latanoprost 0.005 % ophthalmic solution Commonly known as: XALATAN 1 drop at bedtime.   losartan 100 MG tablet Commonly known as: COZAAR Take 1 tablet (100 mg total) by mouth daily.   metFORMIN 500 MG 24 hr tablet Commonly known as: GLUCOPHAGE-XR Take 2 tablets (1,000 mg total) by mouth daily with breakfast. Replaces: metFORMIN 500 MG tablet Started by: Dorita Sciara, MD   OneTouch Verio test strip Generic drug: glucose blood 1 each by Other route daily in the afternoon. Daily What changed:  how much to take how to take this when to take this Changed by: Dorita Sciara, MD   tadalafil 5 MG tablet Commonly known as: Cialis Take 1 tablet (5 mg total) by mouth daily.   Vitamin D3 50 MCG (2000 UT) capsule Take 1 capsule (2,000 Units total) by mouth daily.         OBJECTIVE:   Vital Signs: BP (!) 144/90 (BP Location: Left Arm, Patient Position: Sitting, Cuff Size: Small)   Pulse 85   Ht 5\' 8"  (1.727 m)   Wt 191 lb (86.6 kg)   SpO2 99%   BMI 29.04 kg/m   Wt Readings from Last 3 Encounters:  04/28/21 191 lb (86.6 kg)  02/03/21 189 lb 6.4 oz (85.9 kg)  09/17/20 189 lb (85.7 kg)     Exam: General: Pt appears well and is in NAD  Lungs: Clear with good BS bilat with no rales, rhonchi, or wheezes   Heart: RRR with normal S1 and S2 and no gallops; no murmurs; no rub  Abdomen: Normoactive bowel sounds, soft, nontender, without masses or organomegaly palpable  Extremities: No pretibial edema.   Neuro: MS is good with appropriate affect, pt is alert and Ox3    DM foot exam: 12/11/2019 The skin of the feet is intact without sores or ulcerations. The pedal pulses are 2+ on right and 2+ on left. The sensation is intact to a screening 5.07, 10 gram monofilament bilaterally  DATA REVIEWED:  Lab  Results  Component Value Date   HGBA1C 7.0 (A) 04/28/2021   HGBA1C 7.1 (H) 02/03/2021   HGBA1C 6.5 (A) 09/05/2020    ASSESSMENT / PLAN / RECOMMENDATIONS:   1) Type 2 Diabetes Mellitus, Optimally controlled, With CKD III complications - Most recent A1c of 7.0 %. Goal A1c <  7.0 %.    - A1c continues to be at goal  - A1c increased from 6.5% to 7.0 %  will increase jardiance as below  - Will switch Metformin to XR , that way he can take 2 at the same time for ease of taking as he does a lot of traveling - We discussed that if his GFR < 35, will have to stop Metformin     MEDICATIONS: - Continue  Metformin 500 mg XR , 2 tablet  a day  - Increase Jardiance 25 mg daily    EDUCATION / INSTRUCTIONS: BG monitoring instructions: Patient is instructed to check his blood sugars 3 times a week Call [facility] if: BG persistently < 70 I reviewed the Rule of 15 for the treatment of hypoglycemia in detail with the patient. Literature supplied.    2) Diabetic complications:  Eye: Does not have known diabetic retinopathy.  Neuro/ Feet: Does not have known diabetic peripheral neuropathy .  Renal: Patient does have known baseline CKD.  He   is  on an ACEI/ARB at present.   F/U in 6 months     Signed electronically by: Mack Guise, MD  [facility] Endocrinology  [facility] [address] [place], [place] [address] Phone: [phone] FAX: [phone]   CC: Cassandria Anger, MD [place] [address] Phone: [phone]  Fax: [phone]  Return to Endocrinology clinic as below: No future appointments.

## 2021-05-06 ENCOUNTER — Other Ambulatory Visit: Payer: Self-pay | Admitting: Nephrology

## 2021-05-06 DIAGNOSIS — N1832 Chronic kidney disease, stage 3b: Secondary | ICD-10-CM

## 2021-07-07 ENCOUNTER — Other Ambulatory Visit: Payer: Self-pay | Admitting: Nephrology

## 2021-07-07 DIAGNOSIS — N1832 Chronic kidney disease, stage 3b: Secondary | ICD-10-CM

## 2021-07-28 ENCOUNTER — Ambulatory Visit
Admission: RE | Admit: 2021-07-28 | Discharge: 2021-07-28 | Disposition: A | Discharge status: Home / Self Care | Payer: 59 | Source: Ambulatory Visit | Attending: Nephrology | Admitting: Nephrology

## 2021-07-28 DIAGNOSIS — N1832 Chronic kidney disease, stage 3b: Secondary | ICD-10-CM

## 2021-08-08 ENCOUNTER — Telehealth: Payer: Self-pay | Admitting: Internal Medicine

## 2021-08-08 NOTE — Telephone Encounter (Signed)
1.Medication Requested: tadalafil (CIALIS) 5 MG tablet  2. Pharmacy (Name, Street, [place]): [id number] - [place], [address]  3. On Med List: yes  4. Last Visit with PCP: 02-03-2021  5. Next visit date with PCP: n/a   Agent: Please be advised that RX refills may take up to 3 business days. We ask that you follow-up with your pharmacy.

## 2021-08-11 ENCOUNTER — Other Ambulatory Visit: Payer: Self-pay

## 2021-08-11 MED ORDER — TADALAFIL 5 MG PO TABS: 5.0000 mg | ORAL_TABLET | Freq: Every day | ORAL | 3 refills | Status: DC

## 2021-10-12 ENCOUNTER — Other Ambulatory Visit: Payer: Self-pay | Admitting: Internal Medicine

## 2021-10-31 ENCOUNTER — Ambulatory Visit: Payer: 59 | Admitting: Internal Medicine

## 2022-01-02 LAB — BASIC METABOLIC PANEL
BUN: 22 — AB (ref 4–21)
CO2: 27 — AB (ref 13–22)
Chloride: 100 (ref 99–108)
Creatinine: 1.8 — AB (ref 0.6–1.3)
Glucose: 130
Potassium: 4.6 mEq/L (ref 3.5–5.1)
Sodium: 133 — AB (ref 137–147)

## 2022-01-02 LAB — COMPREHENSIVE METABOLIC PANEL
Albumin: 4 (ref 3.5–5.0)
Calcium: 9.4 (ref 8.7–10.7)
Globulin: 3.5
eGFR: 41

## 2022-01-02 LAB — MICROALBUMIN / CREATININE URINE RATIO: Microalb Creat Ratio: 1552

## 2022-01-02 LAB — PROTEIN / CREATININE RATIO, URINE: Creatinine, Urine: 74.3

## 2022-01-02 LAB — MICROALBUMIN, URINE: Microalb, Ur: 1152.9

## 2022-01-13 ENCOUNTER — Encounter: Payer: Self-pay | Admitting: Internal Medicine

## 2022-02-08 ENCOUNTER — Other Ambulatory Visit: Payer: Self-pay | Admitting: Internal Medicine

## 2022-02-09 ENCOUNTER — Other Ambulatory Visit: Payer: Self-pay | Admitting: Internal Medicine

## 2022-02-12 ENCOUNTER — Other Ambulatory Visit: Payer: Self-pay | Admitting: Internal Medicine

## 2022-04-07 ENCOUNTER — Other Ambulatory Visit: Payer: Self-pay | Admitting: Internal Medicine

## 2022-04-09 ENCOUNTER — Encounter: Payer: Self-pay | Admitting: Internal Medicine

## 2022-04-09 ENCOUNTER — Ambulatory Visit: Payer: 59 | Admitting: Internal Medicine

## 2022-04-09 VITALS — BP 130/80 | HR 71 | Ht 68.0 in | Wt 185.4 lb

## 2022-04-09 DIAGNOSIS — N1831 Chronic kidney disease, stage 3a: Secondary | ICD-10-CM | POA: Diagnosis not present

## 2022-04-09 DIAGNOSIS — E1122 Type 2 diabetes mellitus with diabetic chronic kidney disease: Secondary | ICD-10-CM

## 2022-04-09 LAB — POCT GLYCOSYLATED HEMOGLOBIN (HGB A1C): Hemoglobin A1C: 7.4 % — AB (ref 4.0–5.6)

## 2022-04-09 LAB — POCT GLUCOSE (DEVICE FOR HOME USE): POC Glucose: 130 mg/dl — AB (ref 70–99)

## 2022-04-09 MED ORDER — METFORMIN HCL ER 500 MG PO TB24: 500.0000 mg | ORAL_TABLET | Freq: Two times a day (BID) | ORAL | 3 refills | Status: DC

## 2022-04-09 MED ORDER — ONETOUCH ULTRASOFT LANCETS MISC: 1.0000 | Freq: Every day | 3 refills | Status: DC

## 2022-04-09 MED ORDER — EMPAGLIFLOZIN 25 MG PO TABS: 25.0000 mg | ORAL_TABLET | Freq: Every day | ORAL | 3 refills | Status: DC

## 2022-04-09 MED ORDER — ONETOUCH VERIO VI STRP
1.0000 | ORAL_STRIP | Freq: Every day | 3 refills | Status: DC
Start: 2022-04-09 — End: 2022-11-08

## 2022-04-09 NOTE — Patient Instructions (Signed)
-   Continue Metformin 500 mg, Two tablets a day  - Continue  Jardiance to 25 mg daily      HOW TO TREAT LOW BLOOD SUGARS (Blood sugar LESS THAN 70 MG/DL) Please follow the RULE OF 15 for the treatment of hypoglycemia treatment (when your (blood sugars are less than 70 mg/dL)   STEP 1: Take 15 grams of carbohydrates when your blood sugar is low, which includes:  3-4 GLUCOSE TABS  OR 3-4 OZ OF JUICE OR REGULAR SODA OR ONE TUBE OF GLUCOSE GEL    STEP 2: RECHECK blood sugar in 15 MINUTES STEP 3: If your blood sugar is still low at the 15 minute recheck --> then, go back to STEP 1 and treat AGAIN with another 15 grams of carbohydrates.

## 2022-04-09 NOTE — Progress Notes (Signed)
Name: Troy Beck  Age/ Sex: 66 y.o., male   MRN/ DOB: [id number], [birth year]     PCP: Troy Anger, MD   Reason for Endocrinology Evaluation: Type 2 Diabetes Mellitus  Initial Endocrine Consultative Visit: 12/11/2019    PATIENT IDENTIFIER: [name] is a 66 y.o. male with a past medical history of HTN, T2DM and Dyslipidemia. The patient has followed with Endocrinology clinic since 12/11/2019 for consultative assistance with management of his diabetes.  DIABETIC HISTORY:  [name] was diagnosed with T2DM in 1995. He has been intermittently on metformin since his diagnosis. He was on Repaglinide at some point, no reported intolerance.   His hemoglobin A1c has ranged from 6.7% in 2012, peaking at 8.4% in 2021.    On his initial visit to our clinic he had an A1c of 8.4% . We did not make any changes as he had just made lifestyle changes    He is a residential Veterinary surgeon SUBJECTIVE:   During the last visit (04/28/2021): A1c 7.0%       Today (04/09/2022): [name] is here for a follow up on diabetes management.  He checks his glucose occasionally.   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 No recent UTI's  Denies nausea , vomiting or diarrhea    HOME DIABETES REGIMEN:  Metformin 500 mg BID Jardiance 25 mg daily        Statin: yes ACE-I/ARB: yes    GLUCOSE METER:  133-197 mg/dL     DIABETIC COMPLICATIONS: Microvascular complications:  CKD III Denies: retinopathy, neuropathy Last Eye Exam: Completed  2023  Macrovascular complications:   Denies: CAD, CVA, PVD   HISTORY:  Past Medical History:  Past Medical History:  Diagnosis Date   Arthritis    Asthma    Cataract    cataract but not ready for surgery   Diabetes mellitus    Glaucoma    Heart murmur    may have something but doctor is monitoring it.   Hyperlipidemia    Hypertension    Retinal detachment    Past Surgical History:  Past  Surgical History:  Procedure Laterality Date   EYE SURGERY     FINGER SURGERY     FOOT SURGERY     Social History:  reports that he has never smoked. He has never used smokeless tobacco. He reports current alcohol use. He reports that he does not use drugs. Family History:  Family History  Problem Relation Age of Onset   Heart disease Mother 59       ?aneurism, brain   Heart disease Father 2       ?MI   Hypertension Sister    Stroke Brother    Colon cancer Neg Hx    Colon polyps Neg Hx    Esophageal cancer Neg Hx    Rectal cancer Neg Hx    Stomach cancer Neg Hx      HOME MEDICATIONS: Allergies as of 04/09/2022       Reactions   Quinine Derivatives         Medication List        Accurate as of April 09, 2022  7:44 AM. If you have any questions, ask your nurse or doctor.          amLODipine 10 MG tablet Commonly known as: NORVASC Take 1 tablet (10 mg total) by mouth daily. Annual appt is due must see provider for future refills   ASPIRIN PO Take  by mouth.   atorvastatin 20 MG tablet Commonly known as: LIPITOR Take 1 tablet (20 mg total) by mouth daily. Annual appt is due w/labs must see provider for future refills   Breo Ellipta 100-25 MCG/ACT Aepb Generic drug: fluticasone furoate-vilanterol INHALE 1 PUFF INTO THE LUNGS ONCE DAILY AS DIRECTED - RINSE MOUTH AFTER USE   brimonidine 0.2 % ophthalmic solution Commonly known as: ALPHAGAN 3 (three) times daily.   chlorthalidone 25 MG tablet Commonly known as: HYGROTON Take 25 mg by mouth daily.   dorzolamide 2 % ophthalmic solution Commonly known as: TRUSOPT 1 drop 3 (three) times daily.   doxazosin 4 MG tablet Commonly known as: CARDURA Take 1 tablet (4 mg total) by mouth daily. Annual appt is due w/labs must see provider for future refills   empagliflozin 25 MG Tabs tablet Commonly known as: Jardiance Take 1 tablet (25 mg total) by mouth daily before breakfast.   latanoprost 0.005 %  ophthalmic solution Commonly known as: XALATAN 1 drop at bedtime.   losartan 100 MG tablet Commonly known as: COZAAR Take 1 tablet (100 mg total) by mouth daily.   metFORMIN 500 MG 24 hr tablet Commonly known as: GLUCOPHAGE-XR TAKE 2 TABLETS BY MOUTH EVERY DAY WITH BREAKFAST   OneTouch Verio test strip Generic drug: glucose blood 1 each by Other route daily in the afternoon. Daily   tadalafil 5 MG tablet Commonly known as: Cialis Take 1 tablet (5 mg total) by mouth daily.   Vitamin D3 50 MCG (2000 UT) capsule Take 1 capsule (2,000 Units total) by mouth daily.         OBJECTIVE:   Vital Signs: BP 130/80 (BP Location: Left Arm, Patient Position: Sitting, Cuff Size: Small)   Pulse 71   Ht _0  (1.727 m)   Wt 185 lb 6.4 oz (84.1 kg)   SpO2 97%   BMI 28.19 kg/m   Wt Readings from Last 3 Encounters:  04/09/22 185 lb 6.4 oz (84.1 kg)  04/28/21 191 lb (86.6 kg)  02/03/21 189 lb 6.4 oz (85.9 kg)     Exam: General: Pt appears well and is in NAD  Lungs: Clear with good BS bilat    Heart: RRR   Abdomen: soft, nontender  Extremities: No pretibial edema.   Neuro: MS is good with appropriate affect, pt is alert and Ox3    DM foot exam: 04/09/2022  The skin of the feet is intact without sores or ulcerations. The pedal pulses are 2+ on right and 2+ on left. The sensation is intact to a screening 5.07, 10 gram monofilament bilaterally  DATA REVIEWED:  Lab Results  Component Value Date   HGBA1C 7.4 (A) 04/09/2022   HGBA1C 7.0 (A) 04/28/2021   HGBA1C 7.1 (H) 02/03/2021    Latest Reference Range & Units 01/02/22 10:44  Sodium 137 - 147  133 ! (E)  Potassium 3.5 - 5.1 mEq/L 4.6 (E)  Chloride 99 - 108  100 (E)  CO2 13 - 22  27 ! (E)  Glucose  130 (E)  BUN 4 - 21  22 ! (E)  Creatinine 0.6 - 1.3  1.8 ! (E)  Calcium 8.7 - 10.7  9.4 (E)  eGFR  41 (E)  Albumin 3.5 - 5.0  4.0 (E)     ASSESSMENT / PLAN / RECOMMENDATIONS:   1) Type 2 Diabetes Mellitus, Sub-  Optimally controlled, With CKD III complications - Most recent A1c of 7.4 %. Goal A1c < 7.0 %.     -  His A1c has trended up , we discussed adding a sulfonylurea, but he would like to reduce his CHO intake  - We discussed options of low carb meals, pt tends to eat fruits more the the required servings, he will reduce that  - Unable to increase metformin due to low GFR  - If his GFR < 35, will have to stop Metformin  - Onetouch verio strips and lancets refilled today     MEDICATIONS: - Continue  Metformin 500 mg XR , 2 tablet  a day  - Continue Jardiance 25 mg daily    EDUCATION / INSTRUCTIONS: BG monitoring instructions: Patient is instructed to check his blood sugars 3 times a week Call [facility] if: BG persistently < 70 I reviewed the Rule of 15 for the treatment of hypoglycemia in detail with the patient. Literature supplied.    2) Diabetic complications:  Eye: Does not have known diabetic retinopathy.  Neuro/ Feet: Does not have known diabetic peripheral neuropathy .  Renal: Patient does have known baseline CKD.  He   is  on an ACEI/ARB at present.   F/U in 6 months     Signed electronically by: Mack Guise, MD  [facility]  [facility] [address] [place], [place] [address] Phone: [phone] FAX: [phone]   CC: Troy Anger, MD [place] [address] Phone: [phone]  Fax: [phone]  Return to Endocrinology clinic as below: Future Appointments  Date Time Provider [place]  04/20/2022 10:40 AM Plotnikov, Evie Lacks, MD [facility] None

## 2022-04-15 IMAGING — US US RENAL
1 series · 13 of 25 positions shown · non-contrast
Comparison: CT chest 08/25/2010

CLINICAL DATA: Decreased GFR

EXAM:
RENAL / URINARY TRACT ULTRASOUND COMPLETE

[Series 1: us renal · 0.25mm/px · 13 of 71 slices shown]
[im 1/71]
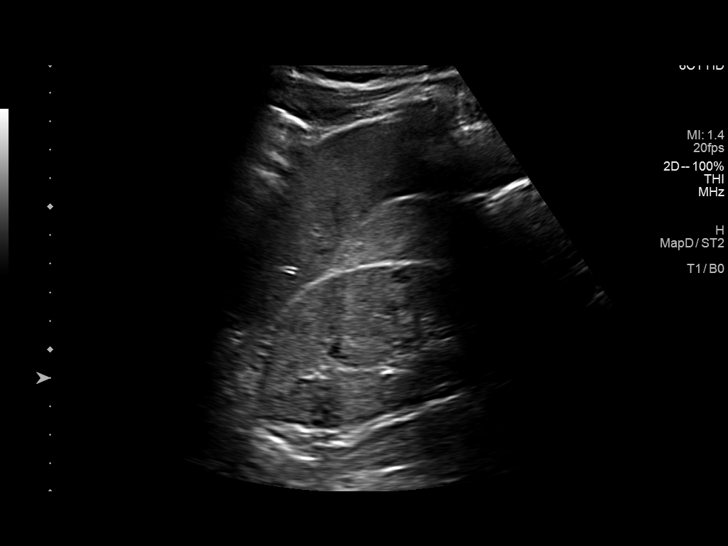
[im 6/71]
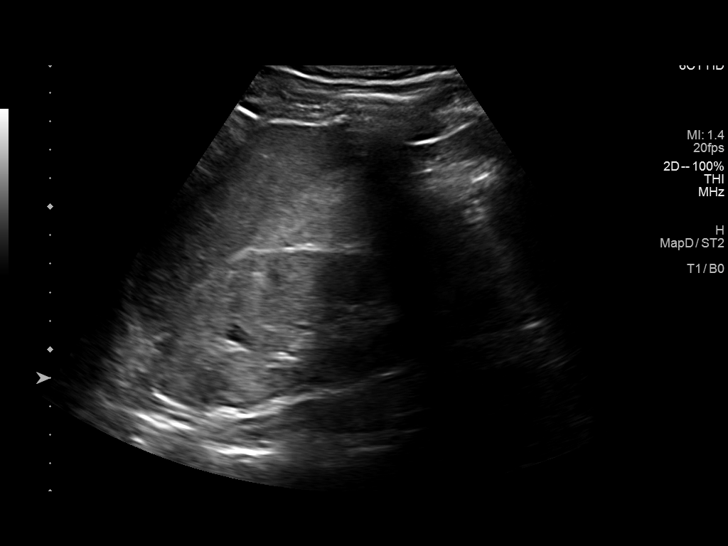
[im 12/71]
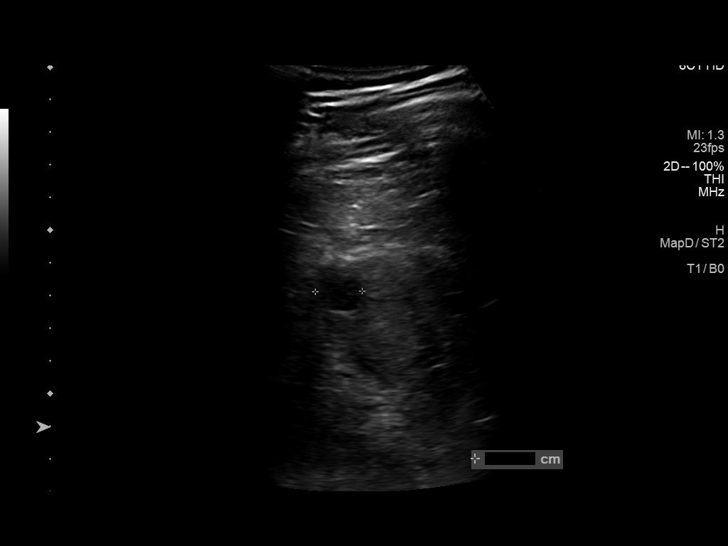
[im 18/71]
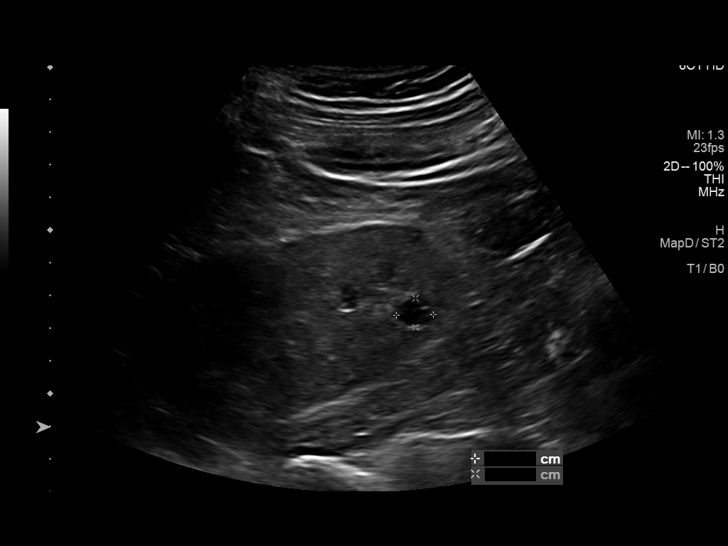
[im 24/71]
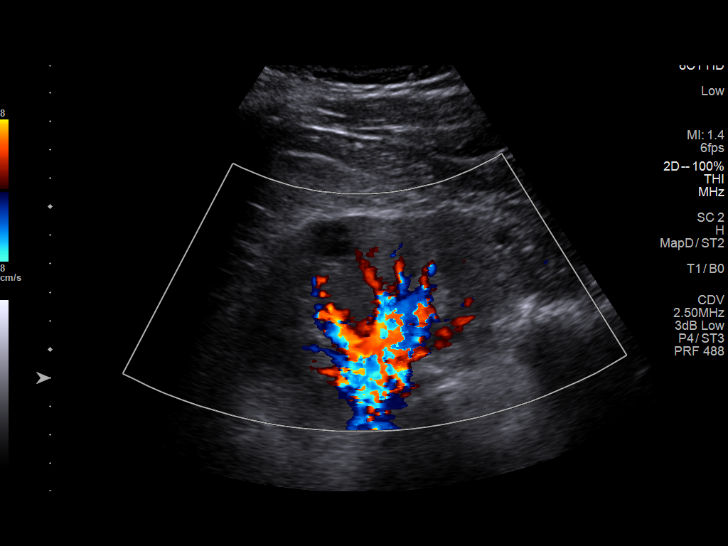
[im 30/71]
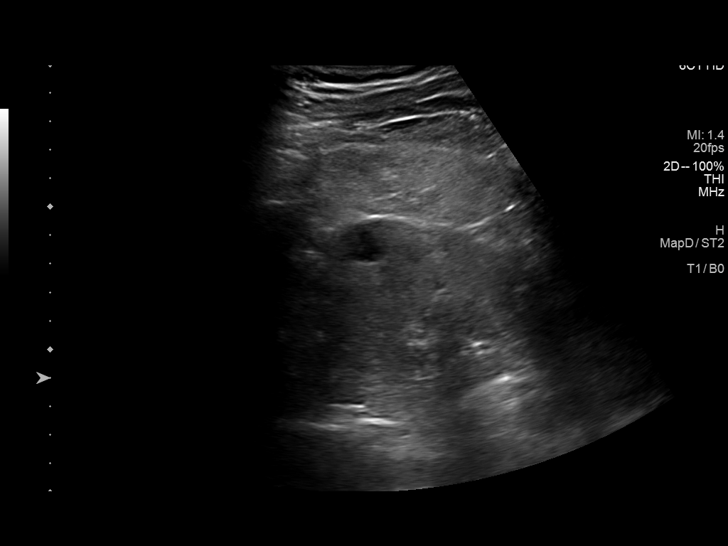
[im 36/71]
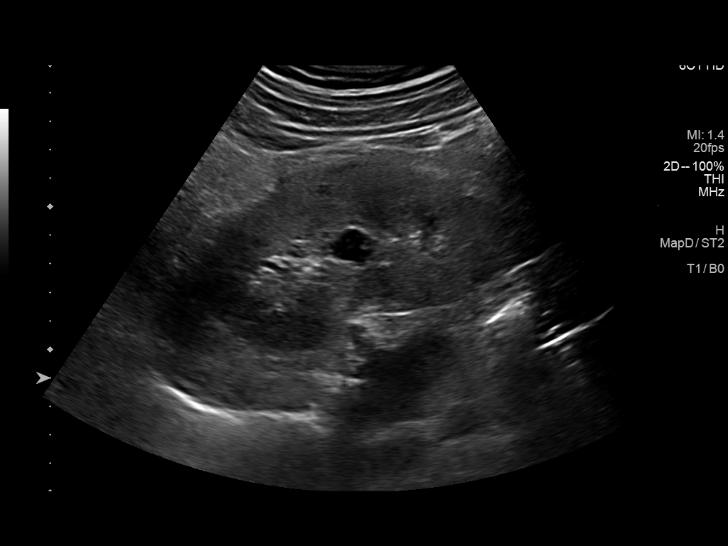
[im 41/71]
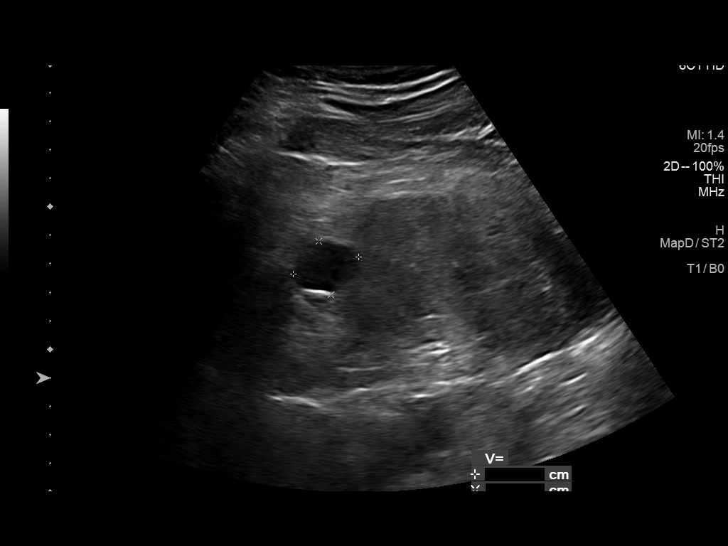
[im 47/71]
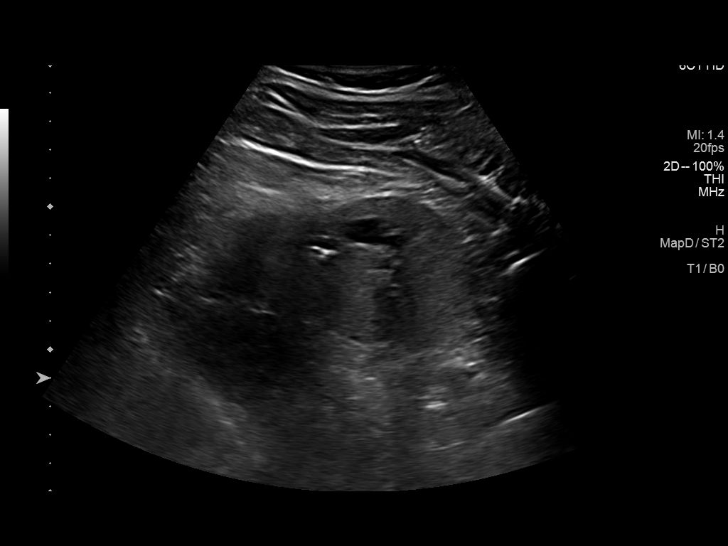
[im 53/71]
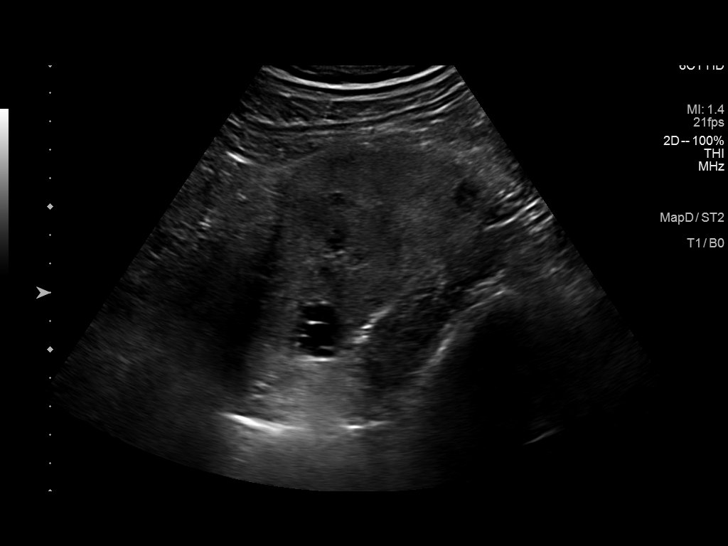
[im 59/71]
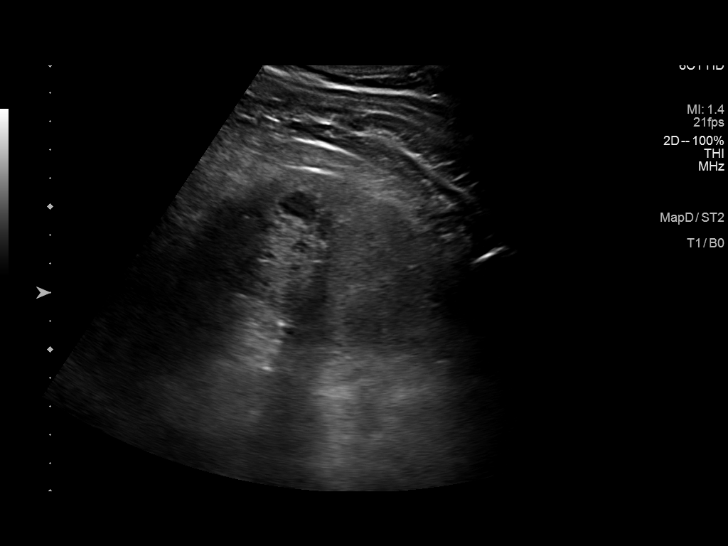
[im 65/71]
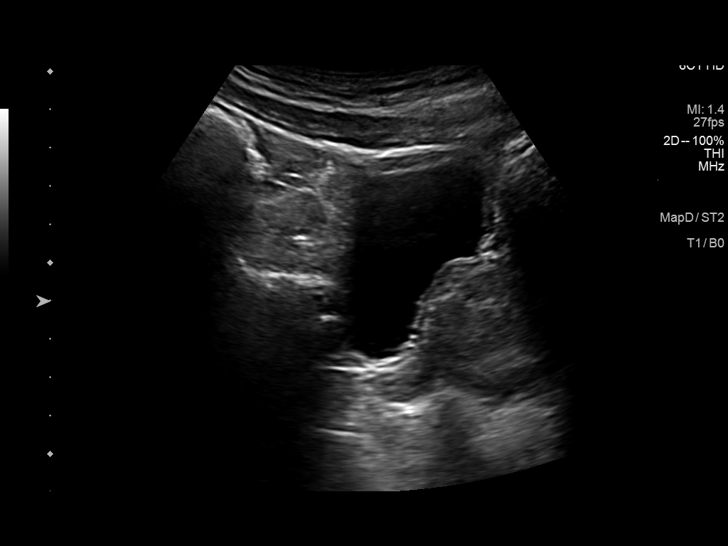
[im 71/71]
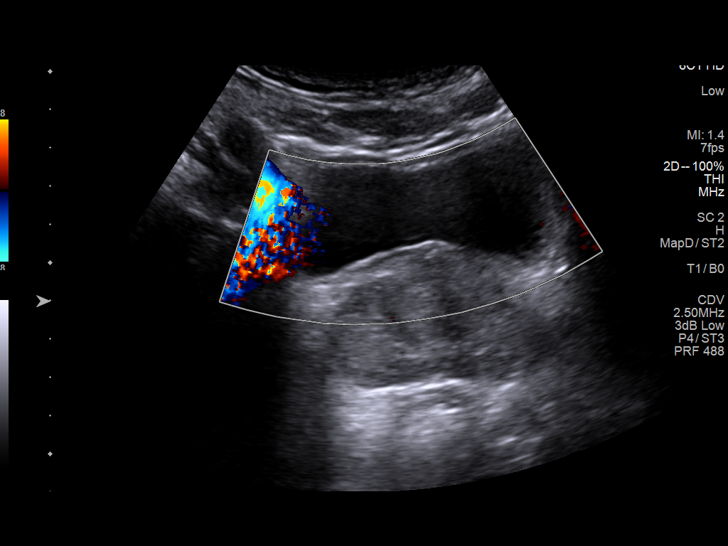

[13 of 25 positions shown; findings below may reference images not displayed]

FINDINGS: Right Kidney:

Renal measurements: 11.8 x 5.4 x 5.6 cm = volume: 185 mL. Renal
cortical echogenicity is mildly increased. Multiple anechoic simple
appearing cysts are present throughout the kidney including cortical
and parapelvic cysts. Largest measures up to 1.6 cm in size. No
concerning cyst features. No discrete concerning renal mass. No
visible shadowing nephrolith or hydronephrosis.

Left Kidney:

Renal measurements: 11.7 x 5.2 x 5.8 cm = volume: 182 mL. Renal
cortical echogenicity is mildly increased. There are multiple
anechoic simple appearing cysts throughout the kidney including a
combination of cortical and parapelvic cysts. A slightly larger
cm cyst in the left kidney demonstrates some thin internal septation
peripheral echogenicity which could reflect a mural calcification or
layering milk of calcium. No other acute or worrisome renal masses,
shadowing calculus or hydronephrosis.

Bladder:

Incompletely distended at the time of exam. Unremarkable for degree
of distension.

Other:

None.
IMPRESSION: 1. Increased cortical echogenicity bilaterally may reflect medical
renal disease.
2. Multiple simple appearing cysts throughout both kidneys including
a combination of cortical and parapelvic cysts
3. Slightly larger 2.4 cm cyst in the upper pole left kidney
demonstrating a thin internal septation and peripheral echogenicity.
These are not particularly worrisome features though could consider
six-month follow-up ultrasound to assess for stability or as
clinically warranted.

## 2022-04-20 ENCOUNTER — Encounter: Payer: Self-pay | Admitting: Internal Medicine

## 2022-04-20 ENCOUNTER — Ambulatory Visit: Payer: 59 | Admitting: Internal Medicine

## 2022-04-20 VITALS — BP 120/82 | HR 94 | Temp 98.3°F | Ht 68.0 in | Wt 185.0 lb

## 2022-04-20 DIAGNOSIS — E785 Hyperlipidemia, unspecified: Secondary | ICD-10-CM

## 2022-04-20 DIAGNOSIS — N183 Chronic kidney disease, stage 3 unspecified: Secondary | ICD-10-CM | POA: Diagnosis not present

## 2022-04-20 DIAGNOSIS — Z23 Encounter for immunization: Secondary | ICD-10-CM | POA: Diagnosis not present

## 2022-04-20 DIAGNOSIS — Z0000 Encounter for general adult medical examination without abnormal findings: Secondary | ICD-10-CM

## 2022-04-20 DIAGNOSIS — N1831 Chronic kidney disease, stage 3a: Secondary | ICD-10-CM

## 2022-04-20 DIAGNOSIS — K635 Polyp of colon: Secondary | ICD-10-CM

## 2022-04-20 DIAGNOSIS — E1122 Type 2 diabetes mellitus with diabetic chronic kidney disease: Secondary | ICD-10-CM | POA: Diagnosis not present

## 2022-04-20 DIAGNOSIS — I10 Essential (primary) hypertension: Secondary | ICD-10-CM | POA: Diagnosis not present

## 2022-04-20 NOTE — Assessment & Plan Note (Signed)
F/u Dr Joelyn Oms - Nephrology Good hydration

## 2022-04-20 NOTE — Assessment & Plan Note (Signed)
Cont on Losartan , Cardura, Norvasc, Jardiance

## 2022-04-20 NOTE — Assessment & Plan Note (Signed)
Check lipids 

## 2022-04-20 NOTE — Assessment & Plan Note (Signed)
On metformin, jardiance

## 2022-04-20 NOTE — Progress Notes (Signed)
Subjective:  Patient ID: Troy Beck, male    DOB: 27-Jul-1955  Age: 66 y.o. MRN: [id number]  CC: Medication Management (Flu shot)   HPI Troy Beck presents for a well exam  Outpatient Medications Prior to Visit  Medication Sig Dispense Refill   amLODipine (NORVASC) 10 MG tablet Take 1 tablet (10 mg total) by mouth daily. Annual appt is due must see provider for future refills 30 tablet 0   ASPIRIN PO Take by mouth.     atorvastatin (LIPITOR) 20 MG tablet Take 1 tablet (20 mg total) by mouth daily. Annual appt is due w/labs must see provider for future refills 30 tablet 0   BREO ELLIPTA 100-25 MCG/ACT AEPB INHALE 1 PUFF INTO THE LUNGS ONCE DAILY AS DIRECTED - RINSE MOUTH AFTER USE 60 each 5   brimonidine (ALPHAGAN) 0.2 % ophthalmic solution 3 (three) times daily.     chlorthalidone (HYGROTON) 25 MG tablet Take 25 mg by mouth daily.     Cholecalciferol (VITAMIN D3) 2000 units capsule Take 1 capsule (2,000 Units total) by mouth daily. 100 capsule 3   dorzolamide (TRUSOPT) 2 % ophthalmic solution 1 drop 3 (three) times daily.     doxazosin (CARDURA) 4 MG tablet Take 1 tablet (4 mg total) by mouth daily. Annual appt is due w/labs must see provider for future refills 30 tablet 0   empagliflozin (JARDIANCE) 25 MG TABS tablet Take 1 tablet (25 mg total) by mouth daily before breakfast. 90 tablet 3   glucose blood (ONETOUCH VERIO) test strip 1 each by Other route daily in the afternoon. 100 each 3   Lancets (ONETOUCH ULTRASOFT) lancets 1 each by Other route daily. Use as instructed 100 each 3   latanoprost (XALATAN) 0.005 % ophthalmic solution 1 drop at bedtime.     losartan (COZAAR) 100 MG tablet Take 1 tablet (100 mg total) by mouth daily. 90 tablet 3   metFORMIN (GLUCOPHAGE-XR) 500 MG 24 hr tablet Take 1 tablet (500 mg total) by mouth 2 (two) times daily with a meal. 180 tablet 3   tadalafil (CIALIS) 5 MG tablet Take 1 tablet (5 mg total) by mouth daily. 90 tablet 3   No  facility-administered medications prior to visit.    ROS: Review of Systems  Constitutional:  Negative for appetite change, fatigue and unexpected weight change.  HENT:  Negative for congestion, nosebleeds, sneezing, sore throat and trouble swallowing.   Eyes:  Negative for itching and visual disturbance.  Respiratory:  Negative for cough.   Cardiovascular:  Negative for chest pain, palpitations and leg swelling.  Gastrointestinal:  Negative for abdominal distention, blood in stool, diarrhea and nausea.  Genitourinary:  Negative for frequency and hematuria.  Musculoskeletal:  Negative for back pain, gait problem, joint swelling and neck pain.  Skin:  Negative for rash.  Neurological:  Negative for dizziness, tremors, speech difficulty and weakness.  Psychiatric/Behavioral:  Negative for agitation, dysphoric mood and sleep disturbance. The patient is not nervous/anxious.     Objective:  BP 120/82 (BP Location: Left Arm)   Pulse 94   Temp 98.3 F (36.8 C) (Oral)   Ht '5\' 8"'$  (1.727 m)   Wt 185 lb (83.9 kg)   SpO2 96%   BMI 28.13 kg/m   BP Readings from Last 3 Encounters:  04/20/22 120/82  04/09/22 130/80  04/28/21 (!) 144/90    Wt Readings from Last 3 Encounters:  04/20/22 185 lb (83.9 kg)  04/09/22 185 lb 6.4 oz (84.1 kg)  04/28/21  191 lb (86.6 kg)    Physical Exam Constitutional:      General: He is not in acute distress.    Appearance: He is well-developed.     Comments: NAD  Eyes:     Conjunctiva/sclera: Conjunctivae normal.     Pupils: Pupils are equal, round, and reactive to light.  Neck:     Thyroid: No thyromegaly.     Vascular: No JVD.  Cardiovascular:     Rate and Rhythm: Normal rate and regular rhythm.     Heart sounds: Normal heart sounds. No murmur heard.    No friction rub. No gallop.  Pulmonary:     Effort: Pulmonary effort is normal. No respiratory distress.     Breath sounds: Normal breath sounds. No wheezing or rales.  Chest:     Chest wall:  No tenderness.  Abdominal:     General: Bowel sounds are normal. There is no distension.     Palpations: Abdomen is soft. There is no mass.     Tenderness: There is no abdominal tenderness. There is no guarding or rebound.  Musculoskeletal:        General: No tenderness. Normal range of motion.     Cervical back: Normal range of motion.  Lymphadenopathy:     Cervical: No cervical adenopathy.  Skin:    General: Skin is warm and dry.     Findings: No rash.  Neurological:     Mental Status: He is alert and oriented to person, place, and time.     Cranial Nerves: No cranial nerve deficit.     Motor: No abnormal muscle tone.     Coordination: Coordination normal.     Gait: Gait normal.     Deep Tendon Reflexes: Reflexes are normal and symmetric.  Psychiatric:        Behavior: Behavior normal.        Thought Content: Thought content normal.        Judgment: Judgment normal.   Rectal - per GI  Lab Results  Component Value Date   WBC 4.8 02/03/2021   HGB 15.3 02/03/2021   HCT 45.6 02/03/2021   PLT 202.0 02/03/2021   GLUCOSE 204 (H) 02/03/2021   CHOL 149 02/03/2021   TRIG 113.0 02/03/2021   HDL 46.40 02/03/2021   LDLCALC 80 02/03/2021   ALT 14 02/03/2021   AST 17 02/03/2021   NA 133 (A) 01/02/2022   K 4.6 01/02/2022   CL 100 01/02/2022   CREATININE 1.8 (A) 01/02/2022   BUN 22 (A) 01/02/2022   CO2 27 (A) 01/02/2022   TSH 1.42 02/03/2021   PSA 2.35 02/03/2021   HGBA1C 7.4 (A) 04/09/2022   MICROALBUR 1,152.9 01/02/2022    US RENAL  Result Date: 07/29/2021 CLINICAL DATA:  Stage 3 chronic kidney disease. EXAM: RENAL / URINARY TRACT ULTRASOUND COMPLETE COMPARISON:  Ultrasound renal 06/19/2020. FINDINGS: Right Kidney: Renal measurements: 12.4 x 5.1 x 5.6 cm = volume: 183 mL. Echogenicity within normal limits. No hydronephrosis. Multiple cysts are identified measuring up to 1.5 x 1.6 x 1.4 cm. There is some indeterminate hypoechoic regions with some internal septations or debris  in the lower pole the right kidney. One measures 9 by 8 x 8 mm in the other measures 11 x 8 x 8 mm. Left Kidney: Renal measurements: 12.3 x 5.8 x 6.0 cm = volume: 224 mL. Echogenicity within normal limits. No hydronephrosis. Multiple cysts are identified measuring up to 1.7 x 1.4 x 1.3 cm. At least 1  complex cyst is identified with thin septation measuring 2.3 x 2.2 x 2.3 cm. Bladder: Appears normal for degree of bladder distention. Other: Prostate gland is enlarged, volume is 43 cc. IMPRESSION: 1. No hydronephrosis. 2. Bilateral renal cysts and complex cysts appear similar to the prior study. 3. Enlarged prostate gland. Electronically Signed   By: Ronney Asters M.D.   On: 07/29/2021 01:24    Assessment & Plan:   Problem List Items Addressed This Visit     Chronic renal insufficiency, stage 3 (moderate) (HCC)    F/u Dr Joelyn Oms - Nephrology Good hydration      Colon polyps    Dr Fuller Plan Colon 2022, due in 2025      Dyslipidemia    Check lipids      Essential hypertension    Cont on Losartan , Cardura, Norvasc, Jardiance      Relevant Orders   TSH   Urinalysis   CBC with Differential/Platelet   Lipid panel   PSA   Type 2 diabetes mellitus with stage 3a chronic kidney disease, without long-term current use of insulin (HCC)    On metformin, jardiance      Relevant Orders   TSH   Urinalysis   CBC with Differential/Platelet   Lipid panel   PSA   Well adult exam - Primary    We discussed age appropriate health related issues, including available/recomended screening tests and vaccinations. Labs were ordered to be later reviewed . All questions were answered. We discussed one or more of the following - seat belt use, use of sunscreen/sun exposure exercise, fall risk reduction, second hand smoke exposure, firearm use and storage, seat belt use, a need for adhering to healthy diet and exercise. Labs were ordered.  All questions were answered. Dr Fuller Plan Colon 2022, due in 2025       Relevant Orders   TSH   Urinalysis   CBC with Differential/Platelet   Lipid panel   PSA   Other Visit Diagnoses     Needs flu shot       Relevant Orders   Flu Vaccine QUAD High Dose(Fluad) (Completed)         No orders of the defined types were placed in this encounter.     Follow-up: Return in about 1 year (around 04/21/2023) for Wellness Exam.  Walker Kehr, MD

## 2022-04-20 NOTE — Assessment & Plan Note (Signed)
Dr Fuller Plan Colon 2022, due in 2025

## 2022-04-20 NOTE — Assessment & Plan Note (Signed)
We discussed age appropriate health related issues, including available/recomended screening tests and vaccinations. Labs were ordered to be later reviewed . All questions were answered. We discussed one or more of the following - seat belt use, use of sunscreen/sun exposure exercise, fall risk reduction, second hand smoke exposure, firearm use and storage, seat belt use, a need for adhering to healthy diet and exercise. Labs were ordered.  All questions were answered. Dr Fuller Plan Colon 2022, due in 2025

## 2022-05-04 ENCOUNTER — Other Ambulatory Visit: Payer: Self-pay | Admitting: Internal Medicine

## 2022-05-31 ENCOUNTER — Other Ambulatory Visit: Payer: Self-pay | Admitting: Internal Medicine

## 2022-08-26 ENCOUNTER — Other Ambulatory Visit: Payer: Self-pay | Admitting: Internal Medicine

## 2022-10-08 ENCOUNTER — Encounter: Payer: Self-pay | Admitting: Internal Medicine

## 2022-10-08 ENCOUNTER — Ambulatory Visit: Payer: 59 | Admitting: Internal Medicine

## 2022-10-08 VITALS — BP 124/80 | HR 74 | Ht 68.0 in | Wt 190.0 lb

## 2022-10-08 DIAGNOSIS — E785 Hyperlipidemia, unspecified: Secondary | ICD-10-CM | POA: Diagnosis not present

## 2022-10-08 DIAGNOSIS — N1831 Chronic kidney disease, stage 3a: Secondary | ICD-10-CM

## 2022-10-08 DIAGNOSIS — E1122 Type 2 diabetes mellitus with diabetic chronic kidney disease: Secondary | ICD-10-CM

## 2022-10-08 LAB — LIPID PANEL
Cholesterol: 155 mg/dL (ref 0–200)
HDL: 40.7 mg/dL (ref >39.00)
LDL Cholesterol: 96 mg/dL (ref 0–99)
NonHDL: 114.56
Total CHOL/HDL Ratio: 4
Triglycerides: 92 mg/dL (ref 0.0–149.0)
VLDL: 18.4 mg/dL (ref 0.0–40.0)

## 2022-10-08 LAB — BASIC METABOLIC PANEL
BUN: 29 mg/dL — ABNORMAL HIGH (ref 6–23)
CO2: 27 mEq/L (ref 19–32)
Calcium: 10 mg/dL (ref 8.4–10.5)
Chloride: 102 mEq/L (ref 96–112)
Creatinine, Ser: 2.15 mg/dL — ABNORMAL HIGH (ref 0.40–1.50)
GFR: 30.95 mL/min — ABNORMAL LOW (ref >60.00)
Glucose, Bld: 126 mg/dL — ABNORMAL HIGH (ref 70–99)
Potassium: 4.6 mEq/L (ref 3.5–5.1)
Sodium: 136 mEq/L (ref 135–145)

## 2022-10-08 LAB — POCT GLYCOSYLATED HEMOGLOBIN (HGB A1C): Hemoglobin A1C: 6.7 % — AB (ref 4.0–5.6)

## 2022-10-08 LAB — MICROALBUMIN / CREATININE URINE RATIO
Creatinine,U: 123.5 mg/dL
Microalb Creat Ratio: 87.1 mg/g — ABNORMAL HIGH (ref 0.0–30.0)
Microalb, Ur: 107.6 mg/dL — ABNORMAL HIGH (ref 0.0–1.9)

## 2022-10-08 NOTE — Progress Notes (Signed)
Name: Troy Beck  Age/ Sex: 67 y.o., male   MRN/ DOB: [id number], [birth year]     PCP: Cassandria Anger, MD   Reason for Endocrinology Evaluation: Type 2 Diabetes Mellitus  Initial Endocrine Consultative Visit: 12/11/2019    PATIENT IDENTIFIER: Troy Beck is a 67 y.o. male with a past medical history of HTN, T2DM and Dyslipidemia. The patient has followed with Endocrinology clinic since 12/11/2019 for consultative assistance with management of his diabetes.  DIABETIC HISTORY:  Troy Beck was diagnosed with T2DM in 1995. He has been intermittently on metformin since his diagnosis. He was on Repaglinide at some point, no reported intolerance.   His hemoglobin A1c has ranged from 6.7% in 2012, peaking at 8.4% in 2021.    On his initial visit to our clinic he had an A1c of 8.4% . We did not make any changes as he had just made lifestyle changes    Restart metformin due to GFR of 30, and started Rybelsus 09/2022  He is a residential Veterinary surgeon SUBJECTIVE:   During the last visit (04/09/2022): A1c 7.4%       Today (10/08/2022): Troy Beck is here for a follow up on diabetes management.  He checks his glucose 1x daily. 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 Joelyn Oms , was started on Kerendia, he also recommended Ozempic to the patient    HOME DIABETES REGIMEN:  Metformin 500 mg, 2 tabs daily  Jardiance 25 mg daily        Statin: yes ACE-I/ARB: yes    GLUCOSE METER:  133-197 mg/dL     DIABETIC COMPLICATIONS: Microvascular complications:  CKD III Denies: retinopathy, neuropathy Last Eye Exam: Completed  2023  Macrovascular complications:   Denies: CAD, CVA, PVD   HISTORY:  Past Medical History:  Past Medical History:  Diagnosis Date   Arthritis    Asthma    Cataract    cataract but not ready for surgery   Diabetes  mellitus    Glaucoma    Heart murmur    may have something but doctor is monitoring it.   Hyperlipidemia    Hypertension    Retinal detachment    Past Surgical History:  Past Surgical History:  Procedure Laterality Date   EYE SURGERY     FINGER SURGERY     FOOT SURGERY     Social History:  reports that he has never smoked. He has never used smokeless tobacco. He reports current alcohol use. He reports that he does not use drugs. Family History:  Family History  Problem Relation Age of Onset   Heart disease Mother 41       ?aneurism, brain   Heart disease Father 68       ?MI   Hypertension Sister    Stroke Brother    Colon cancer Neg Hx    Colon polyps Neg Hx    Esophageal cancer Neg Hx    Rectal cancer Neg Hx    Stomach cancer Neg Hx      HOME MEDICATIONS: Allergies as of 10/08/2022       Reactions   Quinine Derivatives         Medication List        Accurate as of October 08, 2022  9:51 AM. If you have any questions, ask your nurse or doctor.  amLODipine 10 MG tablet Commonly known as: NORVASC Take 1 tablet (10 mg total) by mouth daily.   ASPIRIN PO Take by mouth.   atorvastatin 20 MG tablet Commonly known as: LIPITOR Take 1 tablet (20 mg total) by mouth daily.   Breo Ellipta 100-25 MCG/ACT Aepb Generic drug: fluticasone furoate-vilanterol INHALE 1 PUFF INTO THE LUNGS ONCE DAILY AS DIRECTED - RINSE MOUTH AFTER USE   brimonidine 0.2 % ophthalmic solution Commonly known as: ALPHAGAN 3 (three) times daily.   chlorthalidone 25 MG tablet Commonly known as: HYGROTON Take 25 mg by mouth daily.   dorzolamide 2 % ophthalmic solution Commonly known as: TRUSOPT 1 drop 3 (three) times daily.   doxazosin 4 MG tablet Commonly known as: CARDURA Take 1 tablet (4 mg total) by mouth daily.   empagliflozin 25 MG Tabs tablet Commonly known as: Jardiance Take 1 tablet (25 mg total) by mouth daily before breakfast.   Kerendia 10 MG Tabs Generic  drug: Finerenone Take 10 mg by mouth daily at 6 (six) AM.   latanoprost 0.005 % ophthalmic solution Commonly known as: XALATAN 1 drop at bedtime.   losartan 100 MG tablet Commonly known as: COZAAR Take 1 tablet (100 mg total) by mouth daily.   metFORMIN 500 MG 24 hr tablet Commonly known as: GLUCOPHAGE-XR Take 1 tablet (500 mg total) by mouth 2 (two) times daily with a meal.   onetouch ultrasoft lancets 1 each by Other route daily. Use as instructed   OneTouch Verio test strip Generic drug: glucose blood 1 each by Other route daily in the afternoon.   tadalafil 5 MG tablet Commonly known as: CIALIS TAKE 1 TABLET (5 MG TOTAL) BY MOUTH DAILY.   Vitamin D3 50 MCG (2000 UT) Caps Generic drug: Cholecalciferol Take 1 capsule (2,000 Units total) by mouth daily.         OBJECTIVE:   Vital Signs: BP 124/80 (BP Location: Right Arm, Patient Position: Sitting, Cuff Size: Large)   Pulse 74   Ht 5\' 8"  (1.727 m)   Wt 190 lb (86.2 kg)   SpO2 99%   BMI 28.89 kg/m   Wt Readings from Last 3 Encounters:  10/08/22 190 lb (86.2 kg)  04/20/22 185 lb (83.9 kg)  04/09/22 185 lb 6.4 oz (84.1 kg)     Exam: General: Pt appears well and is in NAD  Lungs: Clear with good BS bilat    Heart: RRR   Abdomen: soft, nontender  Extremities: No pretibial edema.   Neuro: MS is good with appropriate affect, pt is alert and Ox3    DM foot exam: 04/09/2022  The skin of the feet is intact without sores or ulcerations. The pedal pulses are 2+ on right and 2+ on left. The sensation is intact to a screening 5.07, 10 gram monofilament bilaterally  DATA REVIEWED:  Lab Results  Component Value Date   HGBA1C 6.7 (A) 10/08/2022   HGBA1C 7.4 (A) 04/09/2022   HGBA1C 7.0 (A) 04/28/2021    Latest Reference Range & Units 10/08/22 09:46  Sodium 135 - 145 mEq/L 136  Potassium 3.5 - 5.1 mEq/L 4.6  Chloride 96 - 112 mEq/L 102  CO2 19 - 32 mEq/L 27  Glucose 70 - 99 mg/dL 126 (H)  BUN 6 - 23 mg/dL  29 (H)  Creatinine 0.40 - 1.50 mg/dL 2.15 (H)  Calcium 8.4 - 10.5 mg/dL 10.0  GFR >60.00 mL/min 30.95 (L)    Latest Reference Range & Units 10/08/22 09:46  Total CHOL/HDL  Ratio  4  Cholesterol 0 - 200 mg/dL 155  HDL Cholesterol >39.00 mg/dL 40.70  LDL (calc) 0 - 99 mg/dL 96  MICROALB/CREAT RATIO 0.0 - 30.0 mg/g 87.1 (H)  NonHDL  114.56  Triglycerides 0.0 - 149.0 mg/dL 92.0  VLDL 0.0 - 40.0 mg/dL 18.4  (H): Data is abnormally high  Latest Reference Range & Units 10/08/22 09:46  Creatinine,U mg/dL 123.5  Microalb, Ur 0.0 - 1.9 mg/dL 107.6 (H)  MICROALB/CREAT RATIO 0.0 - 30.0 mg/g 87.1 (H)    ASSESSMENT / PLAN / RECOMMENDATIONS:   1) Type 2 Diabetes Mellitus, Optimally controlled, With CKD III complications - Most recent A1c of 6.7 %. Goal A1c < 7.0 %.     -I have praised the patient on optimizing glucose control, but unfortunately his GFR < 35, will have to stop Metformin  -His nephrologist has recommended Ozempic, patient would like to have oral medication, he will stop by the office today to pick up #30 Rybelsus 3 mg tablet and will start on Rybelsus 7 mg tablets after that -Cautioned against GI side effects -His MA/CR ratio has improved dramatically   MEDICATIONS: -Stop metformin 500 mg XR , 2 tablet  a day  -Start Rybelsus 3 mg for a month, then increase to 7 mg daily - Continue Jardiance 25 mg daily    EDUCATION / INSTRUCTIONS: BG monitoring instructions: Patient is instructed to check his blood sugars 3 times a week Call [facility] if: BG persistently < 70 I reviewed the Rule of 15 for the treatment of hypoglycemia in detail with the patient. Literature supplied.    2) Diabetic complications:  Eye: Does not have known diabetic retinopathy.  Neuro/ Feet: Does not have known diabetic peripheral neuropathy .  Renal: Patient does have known baseline CKD.  He   is  on an ACEI/ARB at present.  3)Dyslipidemia:  -Lipid panel acceptable He will  continue on atorvastatin 20 mg daily  F/U in 6 months     Addendum: Discussed lab results with the patient on 10/09/2022 with the recommendation of discontinuing  metformin and starting Rybelsus  Signed electronically by: Mack Guise, MD  [facility]  [address] [place], [place] [address] Phone: [phone] FAX: [phone]   CC: Cassandria Anger, MD [place] [address] Phone: [phone]  Fax: [phone]  Return to Endocrinology clinic as below: Future Appointments  Date Time Provider [place]  04/15/2023  9:30 AM Liel Rudden, Melanie Crazier, MD [id number] None

## 2022-10-08 NOTE — Patient Instructions (Signed)
-   Continue Metformin 500 mg, Two tablets a day  - Continue  Jardiance to 25 mg daily      HOW TO TREAT LOW BLOOD SUGARS (Blood sugar LESS THAN 70 MG/DL) Please follow the RULE OF 15 for the treatment of hypoglycemia treatment (when your (blood sugars are less than 70 mg/dL)   STEP 1: Take 15 grams of carbohydrates when your blood sugar is low, which includes:  3-4 GLUCOSE TABS  OR 3-4 OZ OF JUICE OR REGULAR SODA OR ONE TUBE OF GLUCOSE GEL    STEP 2: RECHECK blood sugar in 15 MINUTES STEP 3: If your blood sugar is still low at the 15 minute recheck --> then, go back to STEP 1 and treat AGAIN with another 15 grams of carbohydrates. 

## 2022-10-09 ENCOUNTER — Telehealth: Payer: Self-pay | Admitting: Internal Medicine

## 2022-10-09 MED ORDER — RYBELSUS 7 MG PO TABS: 7.0000 mg | ORAL_TABLET | Freq: Every day | ORAL | 3 refills | Status: DC

## 2022-10-09 NOTE — Telephone Encounter (Signed)
The patient is going to stop by the office today to get Rybelsus 3 mg samples.   Please have this ready for him at the front   Thank you

## 2022-10-09 NOTE — Telephone Encounter (Signed)
Rybelsus sample has been labeled and placed up front

## 2022-10-09 NOTE — Telephone Encounter (Signed)
Patient came in to office today and picked up 1 sample box of Rybelsus.

## 2022-11-08 ENCOUNTER — Other Ambulatory Visit: Payer: Self-pay

## 2022-11-08 MED ORDER — ONETOUCH ULTRASOFT LANCETS MISC: 1.0000 | Freq: Every day | 3 refills | Status: AC

## 2022-11-08 MED ORDER — ONETOUCH VERIO VI STRP: 1.0000 | ORAL_STRIP | Freq: Every day | 3 refills | Status: DC

## 2022-12-09 LAB — HM DIABETES EYE EXAM

## 2023-03-01 ENCOUNTER — Other Ambulatory Visit: Payer: Self-pay | Admitting: Internal Medicine

## 2023-03-06 LAB — LAB REPORT - SCANNED
Albumin, Urine POC: 1156.4
Creatinine, POC: 118.8 mg/dL
EGFR: 33
Microalb Creat Ratio: 973

## 2023-03-10 ENCOUNTER — Encounter: Payer: Self-pay | Admitting: Nephrology

## 2023-03-15 ENCOUNTER — Ambulatory Visit: Payer: 59 | Admitting: Internal Medicine

## 2023-03-15 ENCOUNTER — Ambulatory Visit (INDEPENDENT_AMBULATORY_CARE_PROVIDER_SITE_OTHER): Payer: 59

## 2023-03-15 ENCOUNTER — Encounter: Payer: Self-pay | Admitting: Internal Medicine

## 2023-03-15 VITALS — BP 126/84 | HR 86 | Temp 98.1°F | Ht 68.0 in | Wt 185.0 lb

## 2023-03-15 DIAGNOSIS — N183 Chronic kidney disease, stage 3 unspecified: Secondary | ICD-10-CM

## 2023-03-15 DIAGNOSIS — I10 Essential (primary) hypertension: Secondary | ICD-10-CM

## 2023-03-15 DIAGNOSIS — M5412 Radiculopathy, cervical region: Secondary | ICD-10-CM

## 2023-03-15 MED ORDER — METHYLPREDNISOLONE 4 MG PO TBPK: ORAL_TABLET | ORAL | 0 refills | Status: DC

## 2023-03-15 NOTE — Progress Notes (Unsigned)
Subjective:  Patient ID: Troy Beck, male    DOB: 13-Aug-1955  Age: 67 y.o. MRN: [id number]  CC: Pain (Pt has stated he believes that he is having constant nerve pain that starts in the neck radiating into the mid-back down lt arm and into left thumb and pointer finger. Pt has numbness in left thumb and pointer finger a/w pain and tingling.)   HPI Bo Storment presents for neck pain, LBP Neck pain irradiates to his L shoulder Fingers #1-2 on the L feel numb all the time x 3 months Sleeps on the L side   Outpatient Medications Prior to Visit  Medication Sig Dispense Refill  . amLODipine (NORVASC) 10 MG tablet Take 1 tablet (10 mg total) by mouth daily. 90 tablet 3  . ASPIRIN PO Take by mouth.    [name] atorvastatin (LIPITOR) 20 MG tablet Take 1 tablet (20 mg total) by mouth daily. 90 tablet 3  . BREO ELLIPTA 100-25 MCG/ACT AEPB INHALE 1 PUFF INTO THE LUNGS ONCE DAILY AS DIRECTED - RINSE MOUTH AFTER USE 60 each 5  . brimonidine (ALPHAGAN) 0.2 % ophthalmic solution 3 (three) times daily.    . chlorthalidone (HYGROTON) 25 MG tablet Take 25 mg by mouth daily.    . Cholecalciferol (VITAMIN D3) 2000 units capsule Take 1 capsule (2,000 Units total) by mouth daily. 100 capsule 3  . dorzolamide (TRUSOPT) 2 % ophthalmic solution 1 drop 3 (three) times daily.    [name] doxazosin (CARDURA) 4 MG tablet Take 1 tablet (4 mg total) by mouth daily. 90 tablet 3  . empagliflozin (JARDIANCE) 25 MG TABS tablet Take 1 tablet (25 mg total) by mouth daily before breakfast. 90 tablet 3  . Finerenone (KERENDIA) 10 MG TABS Take 10 mg by mouth daily at 6 (six) AM.    . glucose blood (ONETOUCH VERIO) test strip 1 each by Other route daily in the afternoon. 100 each 3  . Lancets (ONETOUCH ULTRASOFT) lancets 1 each by Other route daily. Use as instructed 100 each 3  . latanoprost (XALATAN) 0.005 % ophthalmic solution 1 drop at bedtime.    [name] losartan (COZAAR) 100 MG tablet Take 1 tablet (100 mg total) by mouth daily. 90  tablet 3  . Semaglutide (RYBELSUS) 7 MG TABS Take 1 tablet (7 mg total) by mouth daily. 90 tablet 3  . tadalafil (CIALIS) 5 MG tablet Take 1 tablet (5 mg total) by mouth daily. Annual appt due in Oct must see provider for future refills 30 tablet 1   No facility-administered medications prior to visit.    ROS: Review of Systems  Constitutional:  Negative for appetite change, fatigue and unexpected weight change.  HENT:  Negative for congestion, nosebleeds, sneezing, sore throat and trouble swallowing.   Eyes:  Negative for itching and visual disturbance.  Respiratory:  Negative for cough.   Cardiovascular:  Negative for chest pain, palpitations and leg swelling.  Gastrointestinal:  Negative for abdominal distention, blood in stool, diarrhea and nausea.  Genitourinary:  Negative for frequency and hematuria.  Musculoskeletal:  Positive for neck pain. Negative for back pain, gait problem and joint swelling.  Skin:  Negative for rash.  Neurological:  Negative for dizziness, tremors, speech difficulty and weakness.  Psychiatric/Behavioral:  Negative for agitation, dysphoric mood and sleep disturbance. The patient is not nervous/anxious.     Objective:  BP 126/84 (BP Location: Left Arm, Patient Position: Sitting, Cuff Size: Large)   Pulse 86   Temp 98.1 F (36.7 C) (Oral)  Ht 5\' 8"  (1.727 m)   Wt 185 lb (83.9 kg)   SpO2 98%   BMI 28.13 kg/m   BP Readings from Last 3 Encounters:  03/15/23 126/84  10/08/22 124/80  04/20/22 120/82    Wt Readings from Last 3 Encounters:  03/15/23 185 lb (83.9 kg)  10/08/22 190 lb (86.2 kg)  04/20/22 185 lb (83.9 kg)    Physical Exam Constitutional:      General: He is not in acute distress.    Appearance: He is well-developed.     Comments: NAD  Eyes:     Conjunctiva/sclera: Conjunctivae normal.     Pupils: Pupils are equal, round, and reactive to light.  Neck:     Thyroid: No thyromegaly.     Vascular: No JVD.  Cardiovascular:      Rate and Rhythm: Normal rate and regular rhythm.     Heart sounds: Normal heart sounds. No murmur heard.    No friction rub. No gallop.  Pulmonary:     Effort: Pulmonary effort is normal. No respiratory distress.     Breath sounds: Normal breath sounds. No wheezing or rales.  Chest:     Chest wall: No tenderness.  Abdominal:     General: Bowel sounds are normal. There is no distension.     Palpations: Abdomen is soft. There is no mass.     Tenderness: There is no abdominal tenderness. There is no guarding or rebound.  Musculoskeletal:        General: No tenderness. Normal range of motion.     Cervical back: Normal range of motion.  Lymphadenopathy:     Cervical: No cervical adenopathy.  Skin:    General: Skin is warm and dry.     Findings: No rash.  Neurological:     Mental Status: He is alert and oriented to person, place, and time.     Cranial Nerves: No cranial nerve deficit.     Motor: No abnormal muscle tone.     Coordination: Coordination normal.     Gait: Gait normal.     Deep Tendon Reflexes: Reflexes are normal and symmetric.  Psychiatric:        Behavior: Behavior normal.        Thought Content: Thought content normal.        Judgment: Judgment normal.    Lab Results  Component Value Date   WBC 4.8 02/03/2021   HGB 15.3 02/03/2021   HCT 45.6 02/03/2021   PLT 202.0 02/03/2021   GLUCOSE 126 (H) 10/08/2022   CHOL 155 10/08/2022   TRIG 92.0 10/08/2022   HDL 40.70 10/08/2022   LDLCALC 96 10/08/2022   ALT 14 02/03/2021   AST 17 02/03/2021   NA 136 10/08/2022   K 4.6 10/08/2022   CL 102 10/08/2022   CREATININE 2.15 (H) 10/08/2022   BUN 29 (H) 10/08/2022   CO2 27 10/08/2022   TSH 1.42 02/03/2021   PSA 2.35 02/03/2021   HGBA1C 6.7 (A) 10/08/2022   MICROALBUR 107.6 (H) 10/08/2022    US RENAL  Result Date: 07/29/2021 CLINICAL DATA:  Stage 3 chronic kidney disease. EXAM: RENAL / URINARY TRACT ULTRASOUND COMPLETE COMPARISON:  Ultrasound renal 06/19/2020.  FINDINGS: Right Kidney: Renal measurements: 12.4 x 5.1 x 5.6 cm = volume: 183 mL. Echogenicity within normal limits. No hydronephrosis. Multiple cysts are identified measuring up to 1.5 x 1.6 x 1.4 cm. There is some indeterminate hypoechoic regions with some internal septations or debris in the lower pole the  right kidney. One measures 9 by 8 x 8 mm in the other measures 11 x 8 x 8 mm. Left Kidney: Renal measurements: 12.3 x 5.8 x 6.0 cm = volume: 224 mL. Echogenicity within normal limits. No hydronephrosis. Multiple cysts are identified measuring up to 1.7 x 1.4 x 1.3 cm. At least 1 complex cyst is identified with thin septation measuring 2.3 x 2.2 x 2.3 cm. Bladder: Appears normal for degree of bladder distention. Other: Prostate gland is enlarged, volume is 43 cc. IMPRESSION: 1. No hydronephrosis. 2. Bilateral renal cysts and complex cysts appear similar to the prior study. 3. Enlarged prostate gland. Electronically Signed   By: Darliss Cheney M.D.   On: 07/29/2021 01:24    Assessment & Plan:   Problem List Items Addressed This Visit   None Visit Diagnoses     Cervical radiculopathy at C6    -  Primary   Relevant Orders   DG Cervical Spine Complete         Meds ordered this encounter  Medications  . methylPREDNISolone (MEDROL DOSEPAK) 4 MG TBPK tablet    Sig: As directed    Dispense:  21 tablet    Refill:  0      Follow-up: Return in about 2 months (around 05/15/2023) for a follow-up visit.  Sonda Primes, MD

## 2023-03-16 ENCOUNTER — Encounter: Payer: Self-pay | Admitting: Internal Medicine

## 2023-03-16 DIAGNOSIS — M5412 Radiculopathy, cervical region: Secondary | ICD-10-CM | POA: Insufficient documentation

## 2023-03-16 NOTE — Assessment & Plan Note (Signed)
Chronic  Cont on HCTZ , Cardura, Norvasc, Jardiance On Karendia

## 2023-03-16 NOTE — Assessment & Plan Note (Addendum)
New.  There could be overlapping carpal tunnel issue. Use neck pillow Use a wrist splint at night Medrol pack if worse Obtain cervical x-ray.  Obtain cervical MRI if not better. Blue-Emu cream was recommended to use 2-3 times a day Cervical traction options/PT discussed

## 2023-03-16 NOTE — Assessment & Plan Note (Addendum)
Follow-up with Dr. Marisue Humble.  He is on [place] now

## 2023-04-15 ENCOUNTER — Ambulatory Visit: Payer: 59 | Admitting: Internal Medicine

## 2023-04-23 ENCOUNTER — Other Ambulatory Visit: Payer: Self-pay | Admitting: Internal Medicine

## 2023-04-27 ENCOUNTER — Encounter: Payer: Self-pay | Admitting: Internal Medicine

## 2023-04-27 ENCOUNTER — Ambulatory Visit: Payer: 59 | Admitting: Internal Medicine

## 2023-04-27 VITALS — BP 130/82 | HR 92 | Ht 68.0 in | Wt 186.0 lb

## 2023-04-27 DIAGNOSIS — N1831 Chronic kidney disease, stage 3a: Secondary | ICD-10-CM

## 2023-04-27 DIAGNOSIS — E1122 Type 2 diabetes mellitus with diabetic chronic kidney disease: Secondary | ICD-10-CM

## 2023-04-27 DIAGNOSIS — Z7984 Long term (current) use of oral hypoglycemic drugs: Secondary | ICD-10-CM | POA: Diagnosis not present

## 2023-04-27 LAB — POCT GLYCOSYLATED HEMOGLOBIN (HGB A1C): Hemoglobin A1C: 6.7 % — AB (ref 4.0–5.6)

## 2023-04-27 MED ORDER — EMPAGLIFLOZIN 25 MG PO TABS: 25.0000 mg | ORAL_TABLET | Freq: Every day | ORAL | 3 refills | Status: DC

## 2023-04-27 MED ORDER — RYBELSUS 7 MG PO TABS: 7.0000 mg | ORAL_TABLET | Freq: Every day | ORAL | 3 refills | Status: DC

## 2023-04-27 NOTE — Patient Instructions (Addendum)
-   Continue Rybelsus 7 mg daily  - Continue  Jardiance to 25 mg daily      HOW TO TREAT LOW BLOOD SUGARS (Blood sugar LESS THAN 70 MG/DL) Please follow the RULE OF 15 for the treatment of hypoglycemia treatment (when your (blood sugars are less than 70 mg/dL)   STEP 1: Take 15 grams of carbohydrates when your blood sugar is low, which includes:  3-4 GLUCOSE TABS  OR 3-4 OZ OF JUICE OR REGULAR SODA OR ONE TUBE OF GLUCOSE GEL    STEP 2: RECHECK blood sugar in 15 MINUTES STEP 3: If your blood sugar is still low at the 15 minute recheck --> then, go back to STEP 1 and treat AGAIN with another 15 grams of carbohydrates.

## 2023-04-27 NOTE — Progress Notes (Signed)
Name: Troy Beck  Age/ Sex: 67 y.o., male   MRN/ DOB: [id number], 25-Mar-1956     PCP: Tresa Garter, MD   Reason for Endocrinology Evaluation: Type 2 Diabetes Mellitus  Initial Endocrine Consultative Visit: 12/11/2019    PATIENT IDENTIFIER: Troy Beck is a 67 y.o. male with a past medical history of HTN, T2DM and Dyslipidemia. The patient has followed with Endocrinology clinic since 12/11/2019 for consultative assistance with management of his diabetes.  DIABETIC HISTORY:  Troy Beck was diagnosed with T2DM in 1995. He has been intermittently on metformin since his diagnosis. He was on Repaglinide at some point, no reported intolerance.   His hemoglobin A1c has ranged from 6.7% in 2012, peaking at 8.4% in 2021.    On his initial visit to our clinic he had an A1c of 8.4% . We did not make any changes as he had just made lifestyle changes    Stopped  metformin due to GFR of 30, and started Rybelsus 09/2022  He is a residential Associate Professor SUBJECTIVE:   During the last visit (10/08/2022): A1c 6.7%       Today (04/27/2023): Troy Beck is here for a follow up on diabetes management.  He checks his glucose 1x daily.  Weight has been fluctuating Denies nausea or diarrhea  Denies constipation or diarrhea  No recent UTI's  Has arthralgia   Follows with nephrprology - Dr. Marisue Humble     HOME DIABETES REGIMEN:  Rybelsus 7 mg daily  Jardiance 25 mg daily        Statin: yes ACE-I/ARB: yes    METER DOWNLOAD SUMMARY: 9/9-10/02/2023 Fingerstick Blood Glucose Tests = 5 Average Number Tests/Day = 0 Overall Mean FS Glucose = 119 Standard Deviation = 2  BG Ranges: Low = 117 High = 123  BG Target % Results: % In target = 100 % Over target = 0 % Under target = 0  Hypoglycemic Events/30 Days: BG < 50 = 0 Episodes of symptomatic severe hypoglycemia = 0    DIABETIC COMPLICATIONS: Microvascular complications:  CKD III Denies:  retinopathy, neuropathy Last Eye Exam: Completed  2023  Macrovascular complications:   Denies: CAD, CVA, PVD   HISTORY:  Past Medical History:  Past Medical History:  Diagnosis Date   Arthritis    Asthma    Cataract    cataract but not ready for surgery   Diabetes mellitus    Glaucoma    Heart murmur    may have something but doctor is monitoring it.   Hyperlipidemia    Hypertension    Retinal detachment    Past Surgical History:  Past Surgical History:  Procedure Laterality Date   EYE SURGERY     FINGER SURGERY     FOOT SURGERY     Social History:  reports that he has never smoked. He has never used smokeless tobacco. He reports current alcohol use. He reports that he does not use drugs. Family History:  Family History  Problem Relation Age of Onset   Heart disease Mother 46       ?aneurism, brain   Heart disease Father 51       ?MI   Hypertension Sister    Stroke Brother    Colon cancer Neg Hx    Colon polyps Neg Hx    Esophageal cancer Neg Hx    Rectal cancer Neg Hx    Stomach cancer Neg Hx      HOME MEDICATIONS: Allergies as of 04/27/2023  Reactions   Quinine Derivatives         Medication List        Accurate as of April 27, 2023  9:57 AM. If you have any questions, ask your nurse or doctor.          amLODipine 10 MG tablet Commonly known as: NORVASC Take 1 tablet (10 mg total) by mouth daily.   ASPIRIN PO Take by mouth.   atorvastatin 20 MG tablet Commonly known as: LIPITOR Take 1 tablet (20 mg total) by mouth daily.   Breo Ellipta 100-25 MCG/ACT Aepb Generic drug: fluticasone furoate-vilanterol INHALE 1 PUFF INTO THE LUNGS ONCE DAILY AS DIRECTED - RINSE MOUTH AFTER USE   brimonidine 0.2 % ophthalmic solution Commonly known as: ALPHAGAN 3 (three) times daily.   chlorthalidone 25 MG tablet Commonly known as: HYGROTON Take 25 mg by mouth daily.   dorzolamide 2 % ophthalmic solution Commonly known as: TRUSOPT 1 drop  3 (three) times daily.   doxazosin 4 MG tablet Commonly known as: CARDURA Take 1 tablet (4 mg total) by mouth daily.   empagliflozin 25 MG Tabs tablet Commonly known as: Jardiance Take 1 tablet (25 mg total) by mouth daily before breakfast. What changed: how much to take   Kerendia 10 MG Tabs Generic drug: Finerenone Take 10 mg by mouth daily at 6 (six) AM.   latanoprost 0.005 % ophthalmic solution Commonly known as: XALATAN 1 drop at bedtime.   losartan 100 MG tablet Commonly known as: COZAAR Take 1 tablet (100 mg total) by mouth daily.   methylPREDNISolone 4 MG Tbpk tablet Commonly known as: MEDROL DOSEPAK As directed   onetouch ultrasoft lancets 1 each by Other route daily. Use as instructed   OneTouch Verio test strip Generic drug: glucose blood 1 each by Other route daily in the afternoon.   Rybelsus 7 MG Tabs Generic drug: Semaglutide Take 1 tablet (7 mg total) by mouth daily.   tadalafil 5 MG tablet Commonly known as: CIALIS TAKE 1 TABLET (5 MG TOTAL) BY MOUTH DAILY. ANNUAL APPT DUE IN OCT MUST SEE PROVIDER FOR FUTURE REFILLS   Vitamin D3 50 MCG (2000 UT) capsule Take 1 capsule (2,000 Units total) by mouth daily.         OBJECTIVE:   Vital Signs: BP 130/82 (BP Location: Left Arm, Patient Position: Sitting, Cuff Size: Large)   Pulse 92   Ht 5\' 8"  (1.727 m)   Wt 186 lb (84.4 kg)   SpO2 99%   BMI 28.28 kg/m   Wt Readings from Last 3 Encounters:  04/27/23 186 lb (84.4 kg)  03/15/23 185 lb (83.9 kg)  10/08/22 190 lb (86.2 kg)     Exam: General: Pt appears well and is in NAD  Lungs: Clear with good BS bilat    Heart: RRR   Abdomen: soft, nontender  Extremities: No pretibial edema.   Neuro: MS is good with appropriate affect, pt is alert and Ox3    DM foot exam: 04/27/2023  The skin of the feet is intact without sores or ulcerations. The pedal pulses are 2+ on right and 2+ on left. The sensation is intact to a screening 5.07, 10 gram  monofilament bilaterally  DATA REVIEWED:  Lab Results  Component Value Date   HGBA1C 6.7 (A) 04/27/2023   HGBA1C 6.7 (A) 10/08/2022   HGBA1C 7.4 (A) 04/09/2022    03/06/23 12:04  eGFR 33.0 (E)  MICROALB/CREAT RATIO 973 (E)  (E): External lab result  ASSESSMENT / PLAN / RECOMMENDATIONS:   1) Type 2 Diabetes Mellitus, Optimally controlled, With CKD III complications - Most recent A1c of 6.7 %. Goal A1c < 7.0 %.     -A1c has remained optimal -Metformin was discontinued with a GFR <35 -Tolerating medications without side effects, no change  MEDICATIONS:  -Continue Rybelsus 7 mg  daily -Continue Jardiance 25 mg daily    EDUCATION / INSTRUCTIONS: BG monitoring instructions: Patient is instructed to check his blood sugars 3 times a week Call [facility] if: BG persistently < 70 I reviewed the Rule of 15 for the treatment of hypoglycemia in detail with the patient. Literature supplied.    2) Diabetic complications:  Eye: Does not have known diabetic retinopathy.  Neuro/ Feet: Does not have known diabetic peripheral neuropathy .  Renal: Patient does have known baseline CKD.  He   is  on an ACEI/ARB at present.  3)Dyslipidemia:  -Lipid panel acceptable He will continue on atorvastatin 20 mg daily  F/U in 6 months     Addendum: Discussed lab results with the patient on 10/09/2022 with the recommendation of discontinuing  metformin and starting Rybelsus  Signed electronically by: Lyndle Herrlich, MD  [facility] Endocrinology  [facility] [address] [place] [place], [place] [address] Phone: [phone] FAX: [phone]   CC: Tresa Garter, MD [address] [place] [address] Phone: [phone]  Fax: [phone]  Return to Endocrinology clinic as below: Future Appointments  Date Time Provider Department Center  05/11/2023  1:40 PM Plotnikov, Georgina Quint, MD [facility] None

## 2023-04-30 ENCOUNTER — Other Ambulatory Visit: Payer: Self-pay | Admitting: Internal Medicine

## 2023-05-04 ENCOUNTER — Other Ambulatory Visit: Payer: Self-pay | Admitting: Internal Medicine

## 2023-05-11 ENCOUNTER — Ambulatory Visit: Payer: 59 | Admitting: Internal Medicine

## 2023-05-26 ENCOUNTER — Other Ambulatory Visit: Payer: Self-pay | Admitting: Internal Medicine

## 2023-06-14 ENCOUNTER — Encounter: Payer: Self-pay | Admitting: Internal Medicine

## 2023-06-14 ENCOUNTER — Ambulatory Visit: Payer: 59 | Admitting: Internal Medicine

## 2023-06-14 VITALS — BP 130/80 | HR 87 | Temp 98.2°F | Ht 68.0 in | Wt 190.0 lb

## 2023-06-14 DIAGNOSIS — I10 Essential (primary) hypertension: Secondary | ICD-10-CM

## 2023-06-14 DIAGNOSIS — Z7984 Long term (current) use of oral hypoglycemic drugs: Secondary | ICD-10-CM

## 2023-06-14 DIAGNOSIS — E785 Hyperlipidemia, unspecified: Secondary | ICD-10-CM

## 2023-06-14 DIAGNOSIS — G4700 Insomnia, unspecified: Secondary | ICD-10-CM

## 2023-06-14 DIAGNOSIS — N1831 Chronic kidney disease, stage 3a: Secondary | ICD-10-CM

## 2023-06-14 DIAGNOSIS — E1122 Type 2 diabetes mellitus with diabetic chronic kidney disease: Secondary | ICD-10-CM

## 2023-06-14 DIAGNOSIS — M5412 Radiculopathy, cervical region: Secondary | ICD-10-CM

## 2023-06-14 LAB — LIPID PANEL
Cholesterol: 196 mg/dL (ref 0–200)
HDL: 45.3 mg/dL (ref >39.00)
LDL Cholesterol: 135 mg/dL — ABNORMAL HIGH (ref 0–99)
NonHDL: 150.9
Total CHOL/HDL Ratio: 4
Triglycerides: 80 mg/dL (ref 0.0–149.0)
VLDL: 16 mg/dL (ref 0.0–40.0)

## 2023-06-14 LAB — COMPREHENSIVE METABOLIC PANEL
ALT: 12 U/L (ref 0–53)
AST: 16 U/L (ref 0–37)
Albumin: 4.1 g/dL (ref 3.5–5.2)
Alkaline Phosphatase: 61 U/L (ref 39–117)
BUN: 25 mg/dL — ABNORMAL HIGH (ref 6–23)
CO2: 27 meq/L (ref 19–32)
Calcium: 9.9 mg/dL (ref 8.4–10.5)
Chloride: 100 meq/L (ref 96–112)
Creatinine, Ser: 2.14 mg/dL — ABNORMAL HIGH (ref 0.40–1.50)
GFR: 30.97 mL/min — ABNORMAL LOW (ref >60.00)
Glucose, Bld: 93 mg/dL (ref 70–99)
Potassium: 4.7 meq/L (ref 3.5–5.1)
Sodium: 136 meq/L (ref 135–145)
Total Bilirubin: 1 mg/dL (ref 0.2–1.2)
Total Protein: 7.1 g/dL (ref 6.0–8.3)

## 2023-06-14 LAB — TSH: TSH: 1.21 u[IU]/mL (ref 0.35–5.50)

## 2023-06-14 LAB — PSA: PSA: 3.37 ng/mL (ref 0.10–4.00)

## 2023-06-14 MED ORDER — FLUTICASONE FUROATE-VILANTEROL 100-25 MCG/ACT IN AEPB: 1.0000 | INHALATION_SPRAY | Freq: Every day | RESPIRATORY_TRACT | 5 refills | Status: AC

## 2023-06-14 NOTE — Assessment & Plan Note (Signed)
Cont w/Zolpidem prn  Potential benefits of a long term benzodiazepines  use as well as potential risks  and complications were explained to the patient and were aknowledged.

## 2023-06-14 NOTE — Assessment & Plan Note (Signed)
Check lipids

## 2023-06-14 NOTE — Assessment & Plan Note (Signed)
On metformin, jardiance

## 2023-06-14 NOTE — Assessment & Plan Note (Addendum)
Not better Will get an MRI of the C spine PT

## 2023-06-14 NOTE — Progress Notes (Signed)
Subjective:  Patient ID: Troy Beck, male    DOB: 07-20-1956  Age: 67 y.o. MRN: [id number]  CC: Medical Management of Chronic Issues (2 mnth f/u)  HPI [name] presents for DM, HTN,CAD F/u L neck pain - not better  Outpatient Medications Prior to Visit  Medication Sig Dispense Refill  . amLODipine (NORVASC) 10 MG tablet TAKE 1 TABLET BY MOUTH EVERY DAY 90 tablet 1  . ASPIRIN PO Take by mouth.    [name] atorvastatin (LIPITOR) 20 MG tablet TAKE 1 TABLET BY MOUTH EVERY DAY 90 tablet 3  . brimonidine (ALPHAGAN) 0.2 % ophthalmic solution 3 (three) times daily.    . chlorthalidone (HYGROTON) 25 MG tablet Take 25 mg by mouth daily.    . Cholecalciferol (VITAMIN D3) 2000 units capsule Take 1 capsule (2,000 Units total) by mouth daily. 100 capsule 3  . dorzolamide (TRUSOPT) 2 % ophthalmic solution 1 drop 3 (three) times daily.    [name] doxazosin (CARDURA) 4 MG tablet TAKE 1 TABLET BY MOUTH EVERY DAY 90 tablet 1  . empagliflozin (JARDIANCE) 25 MG TABS tablet Take 1 tablet (25 mg total) by mouth daily before breakfast. 90 tablet 3  . Finerenone (KERENDIA) 10 MG TABS Take 10 mg by mouth daily at 6 (six) AM.    . glucose blood (ONETOUCH VERIO) test strip 1 each by Other route daily in the afternoon. 100 each 3  . Lancets (ONETOUCH ULTRASOFT) lancets 1 each by Other route daily. Use as instructed 100 each 3  . latanoprost (XALATAN) 0.005 % ophthalmic solution 1 drop at bedtime.    [name] losartan (COZAAR) 100 MG tablet Take 1 tablet (100 mg total) by mouth daily. 90 tablet 3  . metFORMIN (GLUCOPHAGE-XR) 500 MG 24 hr tablet Take by mouth.    . Semaglutide (RYBELSUS) 7 MG TABS Take 1 tablet (7 mg total) by mouth daily. 90 tablet 3  . tadalafil (CIALIS) 5 MG tablet TAKE 1 TABLET (5 MG TOTAL) BY MOUTH DAILY. ANNUAL APPT DUE IN OCT MUST SEE PROVIDER FOR FUTURE REFILLS 30 tablet 1  . BREO ELLIPTA 100-25 MCG/ACT AEPB INHALE 1 PUFF INTO THE LUNGS ONCE DAILY AS DIRECTED - RINSE MOUTH AFTER USE 60 each 5  .  methylPREDNISolone (MEDROL DOSEPAK) 4 MG TBPK tablet As directed (Patient not taking: Reported on 04/27/2023) 21 tablet 0   No facility-administered medications prior to visit.    ROS: Review of Systems  Constitutional:  Negative for appetite change, fatigue and unexpected weight change.  HENT:  Negative for congestion, nosebleeds, sneezing, sore throat and trouble swallowing.   Eyes:  Negative for itching and visual disturbance.  Respiratory:  Negative for cough.   Cardiovascular:  Negative for chest pain, palpitations and leg swelling.  Gastrointestinal:  Negative for abdominal distention, blood in stool, diarrhea and nausea.  Genitourinary:  Negative for frequency and hematuria.  Musculoskeletal:  Negative for back pain, gait problem, joint swelling and neck pain.  Skin:  Negative for rash.  Neurological:  Negative for dizziness, tremors, speech difficulty and weakness.  Psychiatric/Behavioral:  Negative for agitation, dysphoric mood, sleep disturbance and suicidal ideas. The patient is not nervous/anxious.     Objective:  BP 130/80 (BP Location: Left Arm, Patient Position: Sitting, Cuff Size: Normal)   Pulse 87   Temp 98.2 F (36.8 C) (Oral)   Ht 5\' 8"  (1.727 m)   Wt 190 lb (86.2 kg)   SpO2 96%   BMI 28.89 kg/m   BP Readings from Last 3 Encounters:  06/14/23 130/80  04/27/23 130/82  03/15/23 126/84    Wt Readings from Last 3 Encounters:  06/14/23 190 lb (86.2 kg)  04/27/23 186 lb (84.4 kg)  03/15/23 185 lb (83.9 kg)    Physical Exam Constitutional:      General: He is not in acute distress.    Appearance: He is well-developed.     Comments: NAD  Eyes:     Conjunctiva/sclera: Conjunctivae normal.     Pupils: Pupils are equal, round, and reactive to light.  Neck:     Thyroid: No thyromegaly.     Vascular: No JVD.  Cardiovascular:     Rate and Rhythm: Normal rate and regular rhythm.     Heart sounds: Normal heart sounds. No murmur heard.    No friction rub. No  gallop.  Pulmonary:     Effort: Pulmonary effort is normal. No respiratory distress.     Breath sounds: Normal breath sounds. No wheezing or rales.  Chest:     Chest wall: No tenderness.  Abdominal:     General: Bowel sounds are normal. There is no distension.     Palpations: Abdomen is soft. There is no mass.     Tenderness: There is no abdominal tenderness. There is no guarding or rebound.  Musculoskeletal:        General: No tenderness. Normal range of motion.     Cervical back: Normal range of motion.  Lymphadenopathy:     Cervical: No cervical adenopathy.  Skin:    General: Skin is warm and dry.     Findings: No rash.  Neurological:     Mental Status: He is alert and oriented to person, place, and time.     Cranial Nerves: No cranial nerve deficit.     Motor: No abnormal muscle tone.     Coordination: Coordination normal.     Gait: Gait normal.     Deep Tendon Reflexes: Reflexes are normal and symmetric.  Psychiatric:        Behavior: Behavior normal.        Thought Content: Thought content normal.        Judgment: Judgment normal.    Lab Results  Component Value Date   WBC 4.8 02/03/2021   HGB 15.3 02/03/2021   HCT 45.6 02/03/2021   PLT 202.0 02/03/2021   GLUCOSE 126 (H) 10/08/2022   CHOL 155 10/08/2022   TRIG 92.0 10/08/2022   HDL 40.70 10/08/2022   LDLCALC 96 10/08/2022   ALT 14 02/03/2021   AST 17 02/03/2021   NA 136 10/08/2022   K 4.6 10/08/2022   CL 102 10/08/2022   CREATININE 2.15 (H) 10/08/2022   BUN 29 (H) 10/08/2022   CO2 27 10/08/2022   TSH 1.42 02/03/2021   PSA 2.35 02/03/2021   HGBA1C 6.7 (A) 04/27/2023   MICROALBUR 107.6 (H) 10/08/2022    US RENAL  Result Date: 07/29/2021 CLINICAL DATA:  Stage 3 chronic kidney disease. EXAM: RENAL / URINARY TRACT ULTRASOUND COMPLETE COMPARISON:  Ultrasound renal 06/19/2020. FINDINGS: Right Kidney: Renal measurements: 12.4 x 5.1 x 5.6 cm = volume: 183 mL. Echogenicity within normal limits. No hydronephrosis.  Multiple cysts are identified measuring up to 1.5 x 1.6 x 1.4 cm. There is some indeterminate hypoechoic regions with some internal septations or debris in the lower pole the right kidney. One measures 9 by 8 x 8 mm in the other measures 11 x 8 x 8 mm. Left Kidney: Renal measurements: 12.3 x 5.8 x 6.0 cm =  volume: 224 mL. Echogenicity within normal limits. No hydronephrosis. Multiple cysts are identified measuring up to 1.7 x 1.4 x 1.3 cm. At least 1 complex cyst is identified with thin septation measuring 2.3 x 2.2 x 2.3 cm. Bladder: Appears normal for degree of bladder distention. Other: Prostate gland is enlarged, volume is 43 cc. IMPRESSION: 1. No hydronephrosis. 2. Bilateral renal cysts and complex cysts appear similar to the prior study. 3. Enlarged prostate gland. Electronically Signed   By: Darliss Cheney M.D.   On: 07/29/2021 01:24    Assessment & Plan:   Problem List Items Addressed This Visit     Dyslipidemia    Check lipids      Relevant Orders   Comprehensive metabolic panel   Lipid panel   PSA   TSH   INSOMNIA, PERSISTENT    Cont w/Zolpidem prn  Potential benefits of a long term benzodiazepines  use as well as potential risks  and complications were explained to the patient and were aknowledged.      Essential hypertension    Cont on HCTZ , Cardura, Norvasc, Jardiance On Karendia      Relevant Orders   Comprehensive metabolic panel   Lipid panel   PSA   TSH   Type 2 diabetes mellitus with stage 3a chronic kidney disease, without long-term current use of insulin (HCC)    On metformin, jardiance      Relevant Medications   metFORMIN (GLUCOPHAGE-XR) 500 MG 24 hr tablet   Other Relevant Orders   Comprehensive metabolic panel   Lipid panel   PSA   TSH   Cervical radiculopathy at C6 - Primary    Not better Will get an MRI of the C spine PT      Relevant Orders   Ambulatory referral to Physical Therapy   MR Cervical Spine Wo Contrast      Meds ordered this  encounter  Medications  . fluticasone furoate-vilanterol (BREO ELLIPTA) 100-25 MCG/ACT AEPB    Sig: Inhale 1 puff into the lungs daily.    Dispense:  60 each    Refill:  5      Follow-up: Return in about 4 months (around 10/12/2023) for a follow-up visit.  Sonda Primes, MD

## 2023-06-14 NOTE — Assessment & Plan Note (Signed)
Cont on HCTZ , Cardura, Norvasc, Jardiance On Karendia

## 2023-06-26 ENCOUNTER — Ambulatory Visit
Admission: RE | Admit: 2023-06-26 | Discharge: 2023-06-26 | Disposition: A | Discharge status: Home / Self Care | Payer: 59 | Source: Ambulatory Visit | Attending: Internal Medicine | Admitting: Internal Medicine

## 2023-06-26 DIAGNOSIS — M5412 Radiculopathy, cervical region: Secondary | ICD-10-CM

## 2023-07-09 ENCOUNTER — Encounter: Payer: Self-pay | Admitting: Internal Medicine

## 2023-08-02 ENCOUNTER — Other Ambulatory Visit: Payer: Self-pay | Admitting: Internal Medicine

## 2023-10-26 ENCOUNTER — Ambulatory Visit (INDEPENDENT_AMBULATORY_CARE_PROVIDER_SITE_OTHER): Payer: 59 | Admitting: Internal Medicine

## 2023-10-26 ENCOUNTER — Encounter: Payer: Self-pay | Admitting: Internal Medicine

## 2023-10-26 VITALS — BP 120/78 | HR 86 | Ht 68.0 in | Wt 186.0 lb

## 2023-10-26 DIAGNOSIS — E1122 Type 2 diabetes mellitus with diabetic chronic kidney disease: Secondary | ICD-10-CM

## 2023-10-26 DIAGNOSIS — Z7984 Long term (current) use of oral hypoglycemic drugs: Secondary | ICD-10-CM

## 2023-10-26 DIAGNOSIS — E785 Hyperlipidemia, unspecified: Secondary | ICD-10-CM

## 2023-10-26 DIAGNOSIS — N1831 Chronic kidney disease, stage 3a: Secondary | ICD-10-CM

## 2023-10-26 LAB — LIPID PANEL
Cholesterol: 176 mg/dL (ref <200)
HDL: 42 mg/dL (ref >=40)
LDL Cholesterol (Calc): 107 mg/dL — ABNORMAL HIGH
Non-HDL Cholesterol (Calc): 134 mg/dL — ABNORMAL HIGH (ref <130)
Total CHOL/HDL Ratio: 4.2 (calc) (ref <5.0)
Triglycerides: 153 mg/dL — ABNORMAL HIGH (ref <150)

## 2023-10-26 LAB — POCT GLYCOSYLATED HEMOGLOBIN (HGB A1C): Hemoglobin A1C: 6.6 % — AB (ref 4.0–5.6)

## 2023-10-26 NOTE — Progress Notes (Unsigned)
 Name: Troy Beck  Age/ Sex: 68 y.o., male   MRN/ DOB: [id number], 20-Oct-1955     PCP: Tresa Garter, MD   Reason for Endocrinology Evaluation: Type 2 Diabetes Mellitus  Initial Endocrine Consultative Visit: 12/11/2019    PATIENT IDENTIFIER: Troy Beck is a 68 y.o. male with a past medical history of HTN, T2DM and Dyslipidemia. The patient has followed with Endocrinology clinic since 12/11/2019 for consultative assistance with management of his diabetes.  DIABETIC HISTORY:  Troy Beck was diagnosed with T2DM in 1995. He has been intermittently on metformin since his diagnosis. He was on Repaglinide at some point, no reported intolerance.   His hemoglobin A1c has ranged from 6.7% in 2012, peaking at 8.4% in 2021.    On his initial visit to our clinic he had an A1c of 8.4% . We did not make any changes as he had just made lifestyle changes    Stopped  metformin due to GFR of 30, and started Rybelsus 09/2022  He is a residential Associate Professor SUBJECTIVE:   During the last visit (04/27/2023): A1c 6.7%       Today (10/26/2023): Troy Beck is here for a follow up on diabetes management.  He checks his glucose 1x daily.   Denies nausea or diarrhea  Denies constipation or diarrhea  No recent UTI's   Follows with nephrprology - Dr. Marisue Humble     HOME DIABETES REGIMEN:  Rybelsus 7 mg daily  Jardiance 25 mg daily        Statin: yes ACE-I/ARB: yes    METER DOWNLOAD SUMMARY: 3/10-10/26/2023 Fingerstick Blood Glucose Tests = 3 Average Number Tests/Day = 0 Overall Mean FS Glucose = 116 Standard Deviation = 13  BG Ranges: Low = 101 High = 126  BG Target % Results: % In target = 100 % Over target = 0 % Under target = 0  Hypoglycemic Events/30 Days: BG < 50 = 0 Episodes of symptomatic severe hypoglycemia = 0    DIABETIC COMPLICATIONS: Microvascular complications:  CKD III Denies: retinopathy, neuropathy Last Eye Exam: Completed   2023  Macrovascular complications:   Denies: CAD, CVA, PVD   HISTORY:  Past Medical History:  Past Medical History:  Diagnosis Date   Arthritis    Asthma    Cataract    cataract but not ready for surgery   Diabetes mellitus    Glaucoma    Heart murmur    may have something but doctor is monitoring it.   Hyperlipidemia    Hypertension    Retinal detachment    Past Surgical History:  Past Surgical History:  Procedure Laterality Date   EYE SURGERY     FINGER SURGERY     FOOT SURGERY     Social History:  reports that he has never smoked. He has never used smokeless tobacco. He reports current alcohol use. He reports that he does not use drugs. Family History:  Family History  Problem Relation Age of Onset   Heart disease Mother 42       ?aneurism, brain   Heart disease Father 84       ?MI   Hypertension Sister    Stroke Brother    Colon cancer Neg Hx    Colon polyps Neg Hx    Esophageal cancer Neg Hx    Rectal cancer Neg Hx    Stomach cancer Neg Hx      HOME MEDICATIONS: Allergies as of 10/26/2023  Reactions   Quinine Derivatives         Medication List        Accurate as of October 26, 2023 10:00 AM. If you have any questions, ask your nurse or doctor.          amLODipine 10 MG tablet Commonly known as: NORVASC TAKE 1 TABLET BY MOUTH EVERY DAY   ASPIRIN PO Take by mouth.   atorvastatin 20 MG tablet Commonly known as: LIPITOR TAKE 1 TABLET BY MOUTH EVERY DAY   brimonidine 0.2 % ophthalmic solution Commonly known as: ALPHAGAN 3 (three) times daily.   chlorthalidone 25 MG tablet Commonly known as: HYGROTON Take 25 mg by mouth daily.   dorzolamide 2 % ophthalmic solution Commonly known as: TRUSOPT 1 drop 3 (three) times daily.   doxazosin 4 MG tablet Commonly known as: CARDURA TAKE 1 TABLET BY MOUTH EVERY DAY   empagliflozin 25 MG Tabs tablet Commonly known as: Jardiance Take 1 tablet (25 mg total) by mouth daily before  breakfast.   fluticasone furoate-vilanterol 100-25 MCG/ACT Aepb Commonly known as: Breo Ellipta Inhale 1 puff into the lungs daily.   Kerendia 10 MG Tabs Generic drug: Finerenone Take 10 mg by mouth daily at 6 (six) AM.   latanoprost 0.005 % ophthalmic solution Commonly known as: XALATAN 1 drop at bedtime.   losartan 100 MG tablet Commonly known as: COZAAR Take 1 tablet (100 mg total) by mouth daily.   metFORMIN 500 MG 24 hr tablet Commonly known as: GLUCOPHAGE-XR Take by mouth.   onetouch ultrasoft lancets 1 each by Other route daily. Use as instructed   OneTouch Verio test strip Generic drug: glucose blood 1 each by Other route daily in the afternoon.   Rybelsus 7 MG Tabs Generic drug: Semaglutide Take 1 tablet (7 mg total) by mouth daily.   tadalafil 5 MG tablet Commonly known as: CIALIS TAKE 1 TABLET (5 MG TOTAL) BY MOUTH DAILY. ANNUAL APPT DUE IN OCT MUST SEE PROVIDER FOR FUTURE REFILLS   Vitamin D3 50 MCG (2000 UT) capsule Take 1 capsule (2,000 Units total) by mouth daily.         OBJECTIVE:   Vital Signs: BP 120/78 (BP Location: Left Arm, Patient Position: Sitting, Cuff Size: Normal)   Pulse 86   Ht 5\' 8"  (1.727 m)   Wt 186 lb (84.4 kg)   SpO2 98%   BMI 28.28 kg/m   Wt Readings from Last 3 Encounters:  10/26/23 186 lb (84.4 kg)  06/14/23 190 lb (86.2 kg)  04/27/23 186 lb (84.4 kg)     Exam: General: Pt appears well and is in NAD  Lungs: Clear with good BS bilat    Heart: RRR   Abdomen: soft, nontender  Extremities: No pretibial edema.   Neuro: MS is good with appropriate affect, pt is alert and Ox3    DM foot exam: 04/27/2023  The skin of the feet is intact without sores or ulcerations. The pedal pulses are 2+ on right and 2+ on left. The sensation is intact to a screening 5.07, 10 gram monofilament bilaterally  DATA REVIEWED:  Lab Results  Component Value Date   HGBA1C 6.6 (A) 10/26/2023   HGBA1C 6.7 (A) 04/27/2023   HGBA1C 6.7  (A) 10/08/2022     Latest Reference Range & Units 10/26/23 10:18  Total CHOL/HDL Ratio <5.0 (calc) 4.2  Cholesterol <200 mg/dL 161  HDL Cholesterol > OR = 40 mg/dL 42  LDL Cholesterol (Calc) mg/dL (calc) 096 (H)  Non-HDL Cholesterol (Calc) <130 mg/dL (calc) 409 (H)  Triglycerides <150 mg/dL 811 (H)      Latest Reference Range & Units 06/14/23 11:31  Sodium 135 - 145 mEq/L 136  Potassium 3.5 - 5.1 mEq/L 4.7  Chloride 96 - 112 mEq/L 100  CO2 19 - 32 mEq/L 27  Glucose 70 - 99 mg/dL 93  BUN 6 - 23 mg/dL 25 (H)  Creatinine 9.14 - 1.50 mg/dL 7.82 (H)  Calcium 8.4 - 10.5 mg/dL 9.9  Alkaline Phosphatase 39 - 117 U/L 61  Albumin 3.5 - 5.2 g/dL 4.1  AST 0 - 37 U/L 16  ALT 0 - 53 U/L 12  Total Protein 6.0 - 8.3 g/dL 7.1  Total Bilirubin 0.2 - 1.2 mg/dL 1.0  GFR >95.62 mL/min 30.97 (L)  Total CHOL/HDL Ratio  4  Cholesterol 0 - 200 mg/dL 130  HDL Cholesterol >86.57 mg/dL 84.69  LDL (calc) 0 - 99 mg/dL 629 (H)  NonHDL  528.41  Triglycerides 0.0 - 149.0 mg/dL 32.4  VLDL 0.0 - 40.1 mg/dL 02.7     25/36/64 40:34  eGFR 33.0 (E)  MICROALB/CREAT RATIO 973 (E)     ASSESSMENT / PLAN / RECOMMENDATIONS:   1) Type 2 Diabetes Mellitus, Optimally controlled, With CKD III complications - Most recent A1c of 6.6 %. Goal A1c < 7.0 %.     -A1c has remained optimal -Metformin was discontinued with a GFR <35 -Tolerating medications without side effects, no change  MEDICATIONS:  -Continue Rybelsus 7 mg  daily -Continue Jardiance 25 mg daily    EDUCATION / INSTRUCTIONS: BG monitoring instructions: Patient is instructed to check his blood sugars 3 times a week Call  Endocrinology clinic if: BG persistently < 70 I reviewed the Rule of 15 for the treatment of hypoglycemia in detail with the patient. Literature supplied.    2) Diabetic complications:  Eye: Does not have known diabetic retinopathy.  Neuro/ Feet: Does not have known diabetic peripheral neuropathy .  Renal: Patient  does have known baseline CKD.  He   is  on an ACEI/ARB at present.  3)Dyslipidemia:  -Lipid panel shows elevated LDL in 05/2023, LDL on today's labs continues to show above goal levels - He assures me with compliance  - He is on a low fat diet  -I will change atorvastatin to rosuvastatin as below   Medication  Stop atorvastatin 20 mg daily Start rosuvastatin 20 mg daily  F/U in 6 months      Signed electronically by: Lyndle Herrlich, MD  [facility] Endocrinology  [facility] [address] [place], [place] [address] Phone: [phone] FAX: [phone]   CC: Tresa Garter, MD [address] [place] [address] Phone: [phone]  Fax: [phone]  Return to Endocrinology clinic as below: Future Appointments  Date Time Provider Department Center  12/14/2023 11:00 AM Plotnikov, Georgina Quint, MD [id number] None

## 2023-10-26 NOTE — Patient Instructions (Signed)
-   Continue Rybelsus 7 mg daily  - Continue  Jardiance to 25 mg daily      HOW TO TREAT LOW BLOOD SUGARS (Blood sugar LESS THAN 70 MG/DL) Please follow the RULE OF 15 for the treatment of hypoglycemia treatment (when your (blood sugars are less than 70 mg/dL)   STEP 1: Take 15 grams of carbohydrates when your blood sugar is low, which includes:  3-4 GLUCOSE TABS  OR 3-4 OZ OF JUICE OR REGULAR SODA OR ONE TUBE OF GLUCOSE GEL    STEP 2: RECHECK blood sugar in 15 MINUTES STEP 3: If your blood sugar is still low at the 15 minute recheck --> then, go back to STEP 1 and treat AGAIN with another 15 grams of carbohydrates.

## 2023-10-27 ENCOUNTER — Encounter: Payer: Self-pay | Admitting: Internal Medicine

## 2023-10-27 MED ORDER — ROSUVASTATIN CALCIUM 20 MG PO TABS: 20.0000 mg | ORAL_TABLET | Freq: Every day | ORAL | 3 refills | Status: AC

## 2023-11-10 ENCOUNTER — Other Ambulatory Visit: Payer: Self-pay | Admitting: Internal Medicine

## 2023-12-08 ENCOUNTER — Other Ambulatory Visit: Payer: Self-pay | Admitting: Internal Medicine

## 2023-12-09 ENCOUNTER — Other Ambulatory Visit: Payer: Self-pay | Admitting: Internal Medicine

## 2023-12-14 ENCOUNTER — Ambulatory Visit (INDEPENDENT_AMBULATORY_CARE_PROVIDER_SITE_OTHER): Payer: 59 | Admitting: Internal Medicine

## 2023-12-14 ENCOUNTER — Encounter: Payer: Self-pay | Admitting: Internal Medicine

## 2023-12-14 VITALS — BP 144/86 | HR 72 | Temp 98.4°F | Ht 68.0 in | Wt 182.8 lb

## 2023-12-14 DIAGNOSIS — E785 Hyperlipidemia, unspecified: Secondary | ICD-10-CM | POA: Diagnosis not present

## 2023-12-14 DIAGNOSIS — R972 Elevated prostate specific antigen [PSA]: Secondary | ICD-10-CM | POA: Diagnosis not present

## 2023-12-14 DIAGNOSIS — N1831 Chronic kidney disease, stage 3a: Secondary | ICD-10-CM

## 2023-12-14 DIAGNOSIS — K635 Polyp of colon: Secondary | ICD-10-CM

## 2023-12-14 DIAGNOSIS — E1122 Type 2 diabetes mellitus with diabetic chronic kidney disease: Secondary | ICD-10-CM | POA: Diagnosis not present

## 2023-12-14 DIAGNOSIS — Z7984 Long term (current) use of oral hypoglycemic drugs: Secondary | ICD-10-CM

## 2023-12-14 DIAGNOSIS — I10 Essential (primary) hypertension: Secondary | ICD-10-CM | POA: Diagnosis not present

## 2023-12-14 LAB — COMPREHENSIVE METABOLIC PANEL WITH GFR
ALT: 20 U/L (ref 0–53)
AST: 25 U/L (ref 0–37)
Albumin: 4.1 g/dL (ref 3.5–5.2)
Alkaline Phosphatase: 52 U/L (ref 39–117)
BUN: 35 mg/dL — ABNORMAL HIGH (ref 6–23)
CO2: 23 meq/L (ref 19–32)
Calcium: 9.5 mg/dL (ref 8.4–10.5)
Chloride: 103 meq/L (ref 96–112)
Creatinine, Ser: 2.13 mg/dL — ABNORMAL HIGH (ref 0.40–1.50)
GFR: 31.04 mL/min — ABNORMAL LOW (ref >60.00)
Glucose, Bld: 123 mg/dL — ABNORMAL HIGH (ref 70–99)
Potassium: 4.5 meq/L (ref 3.5–5.1)
Sodium: 134 meq/L — ABNORMAL LOW (ref 135–145)
Total Bilirubin: 0.8 mg/dL (ref 0.2–1.2)
Total Protein: 7.5 g/dL (ref 6.0–8.3)

## 2023-12-14 LAB — LIPID PANEL
Cholesterol: 172 mg/dL (ref 0–200)
HDL: 51 mg/dL (ref >39.00)
LDL Cholesterol: 107 mg/dL — ABNORMAL HIGH (ref 0–99)
NonHDL: 121.42
Total CHOL/HDL Ratio: 3
Triglycerides: 73 mg/dL (ref 0.0–149.0)
VLDL: 14.6 mg/dL (ref 0.0–40.0)

## 2023-12-14 LAB — TSH: TSH: 0.58 u[IU]/mL (ref 0.35–5.50)

## 2023-12-14 NOTE — Assessment & Plan Note (Addendum)
 Colon 2022, due in 2025 Will sch colonoscopy

## 2023-12-14 NOTE — Assessment & Plan Note (Signed)
 Recheck PSA

## 2023-12-14 NOTE — Progress Notes (Signed)
 Subjective:  Patient ID: Troy Beck, male    DOB: 20-Jan-1956  Age: 68 y.o. MRN: [id number]  CC: Medical Management of Chronic Issues (6 month Follow Up. No current concerns. Has been working better with diet. Due for colonoscopy again, would like to have this past July though)   HPI Terrian Sentell presents for DM, HTN, asthma  Outpatient Medications Prior to Visit  Medication Sig Dispense Refill   amLODipine  (NORVASC ) 10 MG tablet TAKE 1 TABLET BY MOUTH EVERY DAY 90 tablet 1   ASPIRIN PO Take by mouth.     brimonidine (ALPHAGAN) 0.2 % ophthalmic solution 3 (three) times daily.     chlorthalidone (HYGROTON) 25 MG tablet Take 25 mg by mouth daily.     Cholecalciferol (VITAMIN D3) 2000 units capsule Take 1 capsule (2,000 Units total) by mouth daily. 100 capsule 3   dorzolamide (TRUSOPT) 2 % ophthalmic solution 1 drop 3 (three) times daily.     doxazosin  (CARDURA ) 4 MG tablet TAKE 1 TABLET BY MOUTH EVERY DAY 90 tablet 1   empagliflozin  (JARDIANCE ) 25 MG TABS tablet Take 1 tablet (25 mg total) by mouth daily before breakfast. 90 tablet 3   Finerenone (KERENDIA) 10 MG TABS Take 10 mg by mouth daily at 6 (six) AM.     fluticasone  furoate-vilanterol (BREO ELLIPTA ) 100-25 MCG/ACT AEPB Inhale 1 puff into the lungs daily. 60 each 5   Lancets (ONETOUCH ULTRASOFT) lancets 1 each by Other route daily. Use as instructed 100 each 3   latanoprost (XALATAN) 0.005 % ophthalmic solution 1 drop at bedtime.     losartan  (COZAAR ) 100 MG tablet Take 1 tablet (100 mg total) by mouth daily. 90 tablet 3   metFORMIN  (GLUCOPHAGE -XR) 500 MG 24 hr tablet Take by mouth.     ONETOUCH VERIO test strip 1 EACH BY OTHER ROUTE DAILY IN THE AFTERNOON. 100 strip 3   rosuvastatin  (CRESTOR ) 20 MG tablet Take 1 tablet (20 mg total) by mouth daily. 90 tablet 3   Semaglutide  (RYBELSUS ) 7 MG TABS Take 1 tablet (7 mg total) by mouth daily. 90 tablet 3   tadalafil  (CIALIS ) 5 MG tablet TAKE 1 TABLET (5 MG TOTAL) BY MOUTH DAILY.  ANNUAL APPT DUE IN OCT MUST SEE PROVIDER FOR FUTURE REFILLS 30 tablet 1   No facility-administered medications prior to visit.    ROS: Review of Systems  Constitutional:  Negative for appetite change, fatigue and unexpected weight change.  HENT:  Negative for congestion, nosebleeds, sneezing, sore throat and trouble swallowing.   Eyes:  Negative for itching and visual disturbance.  Respiratory:  Negative for cough.   Cardiovascular:  Negative for chest pain, palpitations and leg swelling.  Gastrointestinal:  Negative for abdominal distention, blood in stool, diarrhea and nausea.  Genitourinary:  Negative for frequency and hematuria.  Musculoskeletal:  Negative for back pain, gait problem, joint swelling and neck pain.  Skin:  Negative for rash.  Neurological:  Negative for dizziness, tremors, speech difficulty and weakness.  Psychiatric/Behavioral:  Negative for agitation, dysphoric mood, sleep disturbance and suicidal ideas. The patient is not nervous/anxious.     Objective:  BP (!) 144/86   Pulse 72   Temp 98.4 F (36.9 C)   Ht 5\' 8"  (1.727 m)   Wt 182 lb 12.8 oz (82.9 kg)   SpO2 98%   BMI 27.79 kg/m   BP Readings from Last 3 Encounters:  12/14/23 (!) 144/86  10/26/23 120/78  06/14/23 130/80    Wt Readings from Last  3 Encounters:  12/14/23 182 lb 12.8 oz (82.9 kg)  10/26/23 186 lb (84.4 kg)  06/14/23 190 lb (86.2 kg)    Physical Exam Constitutional:      General: He is not in acute distress.    Appearance: Normal appearance. He is well-developed.     Comments: NAD  Eyes:     Conjunctiva/sclera: Conjunctivae normal.     Pupils: Pupils are equal, round, and reactive to light.  Neck:     Thyroid : No thyromegaly.     Vascular: No JVD.  Cardiovascular:     Rate and Rhythm: Normal rate and regular rhythm.     Heart sounds: Normal heart sounds. No murmur heard.    No friction rub. No gallop.  Pulmonary:     Effort: Pulmonary effort is normal. No respiratory  distress.     Breath sounds: Normal breath sounds. No wheezing or rales.  Chest:     Chest wall: No tenderness.  Abdominal:     General: Bowel sounds are normal. There is no distension.     Palpations: Abdomen is soft. There is no mass.     Tenderness: There is no abdominal tenderness. There is no guarding or rebound.  Musculoskeletal:        General: No tenderness. Normal range of motion.     Cervical back: Normal range of motion.  Lymphadenopathy:     Cervical: No cervical adenopathy.  Skin:    General: Skin is warm and dry.     Findings: No rash.  Neurological:     Mental Status: He is alert and oriented to person, place, and time.     Cranial Nerves: No cranial nerve deficit.     Motor: No abnormal muscle tone.     Coordination: Coordination normal.     Gait: Gait normal.     Deep Tendon Reflexes: Reflexes are normal and symmetric.  Psychiatric:        Behavior: Behavior normal.        Thought Content: Thought content normal.        Judgment: Judgment normal.     Lab Results  Component Value Date   WBC 4.8 02/03/2021   HGB 15.3 02/03/2021   HCT 45.6 02/03/2021   PLT 202.0 02/03/2021   GLUCOSE 93 06/14/2023   CHOL 176 10/26/2023   TRIG 153 (H) 10/26/2023   HDL 42 10/26/2023   LDLCALC 107 (H) 10/26/2023   ALT 12 06/14/2023   AST 16 06/14/2023   NA 136 06/14/2023   K 4.7 06/14/2023   CL 100 06/14/2023   CREATININE 2.14 (H) 06/14/2023   BUN 25 (H) 06/14/2023   CO2 27 06/14/2023   TSH 1.21 06/14/2023   PSA 3.37 06/14/2023   HGBA1C 6.6 (A) 10/26/2023   MICROALBUR 107.6 (H) 10/08/2022    MR Cervical Spine Wo Contrast Result Date: 07/09/2023 CLINICAL DATA:  Cervical radiculopathy with pain radiating to the neck and left shoulder. EXAM: MRI CERVICAL SPINE WITHOUT CONTRAST TECHNIQUE: Multiplanar, multisequence MR imaging of the cervical spine was performed. No intravenous contrast was administered. COMPARISON:  None Available. FINDINGS: Alignment: No static  listhesis. Loss of the normal cervical lordosis with straightening. Vertebrae: No acute fracture, evidence of discitis, or aggressive bone lesion. Cord: Normal signal and morphology. Posterior Fossa, vertebral arteries, paraspinal tissues: Posterior fossa demonstrates no focal abnormality. Vertebral artery flow voids are maintained. Paraspinal soft tissues are unremarkable. Disc levels: Discs: Mild disc height loss at C5-6 with reactive endplate changes. Reactive endplate edema  along the inferior endplate of C6. C2-3: Mild disc bulge. Severe right facet arthropathy. Mild left facet arthropathy. Severe bilateral foraminal stenosis. No central canal stenosis. C3-4: Mild disc bulge. Mild bilateral facet arthropathy. Moderate-severe bilateral foraminal stenosis. No central canal stenosis. C4-5: Mild disc bulge. Severe bilateral foraminal stenosis. Mild bilateral facet arthropathy. No central canal stenosis. C5-6: Mild disc bulge. Severe bilateral foraminal stenosis. No central canal stenosis. C6-7: Mild disc bulge with a broad central disc protrusion contacting the ventral cervical spinal cord. Severe bilateral foraminal stenosis. Mild central canal stenosis. C7-T1: Mild disc bulge with a small central disc protrusion. Moderate right foraminal stenosis. Mild left foraminal stenosis. No central canal stenosis. IMPRESSION: 1. Diffuse cervical spine spondylosis as described above. 2. No acute osseous injury of the cervical spine. Electronically Signed   By: Onnie Bilis M.D.   On: 07/09/2023 13:09    Assessment & Plan:   Problem List Items Addressed This Visit     Dyslipidemia   Check lipids Off Lipitor On Crestor  now      Relevant Orders   Comprehensive metabolic panel with GFR   Lipid panel   Essential hypertension   Cont on HCTZ , Cardura , Norvasc , Jardiance  On Karendia      Type 2 diabetes mellitus with stage 3a chronic kidney disease, without long-term current use of insulin (HCC) - Primary   On  metformin , jardiance       Relevant Orders   Comprehensive metabolic panel with GFR   Lipid panel   TSH   Colon polyps   Colon 2022, due in 2025 Will sch colonoscopy      Relevant Orders   Ambulatory referral to Gastroenterology   Elevated PSA   Recheck PSA         No orders of the defined types were placed in this encounter.     Follow-up: Return in about 6 months (around 06/15/2024) for a follow-up visit.  Anitra Barn, MD

## 2023-12-14 NOTE — Assessment & Plan Note (Signed)
 On metformin, jardiance

## 2023-12-14 NOTE — Assessment & Plan Note (Signed)
 Cont on HCTZ , Cardura, Norvasc, Jardiance On Karendia

## 2023-12-14 NOTE — Assessment & Plan Note (Addendum)
 Check lipids Off Lipitor On Crestor  now

## 2023-12-21 ENCOUNTER — Ambulatory Visit: Payer: Self-pay | Admitting: Internal Medicine

## 2024-02-01 ENCOUNTER — Other Ambulatory Visit: Payer: Self-pay | Admitting: Internal Medicine

## 2024-02-21 ENCOUNTER — Other Ambulatory Visit: Payer: Self-pay | Admitting: Internal Medicine

## 2024-03-03 ENCOUNTER — Encounter: Payer: Self-pay | Admitting: Gastroenterology

## 2024-03-07 ENCOUNTER — Ambulatory Visit (AMBULATORY_SURGERY_CENTER)

## 2024-03-07 VITALS — Ht 68.0 in | Wt 182.0 lb

## 2024-03-07 DIAGNOSIS — Z860100 Personal history of colon polyps, unspecified: Secondary | ICD-10-CM

## 2024-03-07 MED ORDER — NA SULFATE-K SULFATE-MG SULF 17.5-3.13-1.6 GM/177ML PO SOLN: 1.0000 | Freq: Once | ORAL | 0 refills | Status: AC

## 2024-03-07 NOTE — Progress Notes (Signed)

## 2024-03-21 ENCOUNTER — Encounter: Payer: Self-pay | Admitting: Gastroenterology

## 2024-03-21 ENCOUNTER — Ambulatory Visit: Admitting: Gastroenterology

## 2024-03-21 VITALS — BP 138/89 | HR 58 | Temp 97.3°F | Resp 14 | Ht 68.0 in | Wt 184.0 lb

## 2024-03-21 DIAGNOSIS — D123 Benign neoplasm of transverse colon: Secondary | ICD-10-CM

## 2024-03-21 DIAGNOSIS — D127 Benign neoplasm of rectosigmoid junction: Secondary | ICD-10-CM | POA: Diagnosis not present

## 2024-03-21 DIAGNOSIS — Z1211 Encounter for screening for malignant neoplasm of colon: Secondary | ICD-10-CM

## 2024-03-21 DIAGNOSIS — Z860101 Personal history of adenomatous and serrated colon polyps: Secondary | ICD-10-CM

## 2024-03-21 DIAGNOSIS — D124 Benign neoplasm of descending colon: Secondary | ICD-10-CM

## 2024-03-21 DIAGNOSIS — Z860100 Personal history of colon polyps, unspecified: Secondary | ICD-10-CM

## 2024-03-21 DIAGNOSIS — K640 First degree hemorrhoids: Secondary | ICD-10-CM

## 2024-03-21 MED ORDER — SODIUM CHLORIDE 0.9 % IV SOLN: 500.0000 mL | Freq: Once | INTRAVENOUS | Status: DC

## 2024-03-21 MED ORDER — DEXTROSE 5 % IV SOLN: INTRAVENOUS | Status: AC

## 2024-03-21 NOTE — Op Note (Signed)
 [facility] Patient Name: Troy Beck Procedure Date: 03/21/2024 3:10 PM MRN: [id number] Endoscopist: Aloha Finner , MD, [id number] Age: 68 Referring MD:  Date of Birth: 08/25/1955 Gender: Male Account #: [id number] Procedure:                Colonoscopy Indications:              Surveillance: Personal history of adenomatous                            polyps on last colonoscopy 3 years ago, High risk                            colon cancer surveillance: Personal history of                            adenoma (10 mm or greater in size), High risk colon                            cancer surveillance: Personal history of                            non-advanced adenoma Medicines:                Monitored Anesthesia Care Procedure:                Pre-Anesthesia Assessment:                           - Prior to the procedure, a History and Physical                            was performed, and patient medications and                            allergies were reviewed. The patient's tolerance of                            previous anesthesia was also reviewed. The risks                            and benefits of the procedure and the sedation                            options and risks were discussed with the patient.                            All questions were answered, and informed consent                            was obtained. Prior Anticoagulants: The patient has                            taken no anticoagulant or antiplatelet agents  except for aspirin. ASA Grade Assessment: III - A                            patient with severe systemic disease. After                            reviewing the risks and benefits, the patient was                            deemed in satisfactory condition to undergo the                            procedure.                           After obtaining informed consent, the colonoscope                             was passed under direct vision. Throughout the                            procedure, the patient's blood pressure, pulse, and                            oxygen saturations were monitored continuously. The                            Olympus Scope [id number] was introduced through the                            anus and advanced to the the cecum, identified by                            appendiceal orifice and ileocecal valve. The                            colonoscopy was performed without difficulty. The                            patient tolerated the procedure. The quality of the                            bowel preparation was adequate. The ileocecal                            valve, appendiceal orifice, and rectum were                            photographed. Scope In: 3:16:33 PM Scope Out: 3:36:14 PM Scope Withdrawal Time: 0 hours 16 minutes 6 seconds  Total Procedure Duration: 0 hours 19 minutes 41 seconds  Findings:                 The digital rectal exam was normal. Pertinent  negatives include no palpable rectal lesions.                           A moderate amount of semi-liquid stool was found in                            the entire colon, without interference to                            visualization. Lavage of the area was performed                            using copious amounts, resulting in clearance with                            adequate visualization.                           Five sessile polyps were found in the recto-sigmoid                            colon (1), descending colon (1) and hepatic flexure                            (3). The polyps were 2 to 5 mm in size. These                            polyps were removed with a cold snare. Resection                            and retrieval were complete.                           Normal mucosa was found in the entire colon                            otherwise.                            Non-bleeding non-thrombosed internal hemorrhoids                            were found during retroflexion, during perianal                            exam and during digital exam. The hemorrhoids were                            Grade I (internal hemorrhoids that do not prolapse). Complications:            No immediate complications. Estimated Blood Loss:     Estimated blood loss was minimal. Impression:               - Stool in the entire examined colon.                           -  Five 2 to 5 mm polyps at the recto-sigmoid colon,                            in the descending colon and at the hepatic flexure,                            removed with a cold snare. Resected and retrieved.                           - Normal mucosa in the entire examined colon                            otherwise.                           - Non-bleeding non-thrombosed internal hemorrhoids. Recommendation:           - The patient will be observed post-procedure,                            until all discharge criteria are met.                           - Discharge patient to home.                           - Patient has a contact number available for                            emergencies. The signs and symptoms of potential                            delayed complications were discussed with the                            patient. Return to normal activities tomorrow.                            Written discharge instructions were provided to the                            patient.                           - High fiber diet.                           - Use FiberCon 1-2 tablets PO daily.                           - Continue present medications.                           - Await pathology results.                           -  Repeat colonoscopy in 3 - 5 years for                            surveillance based on pathology results.                           - The findings and recommendations were discussed                             with the patient.                           - The findings and recommendations were discussed                            with the patient's family. Aloha Finner, MD 03/21/2024 3:40:41 PM

## 2024-03-21 NOTE — Progress Notes (Unsigned)
 GASTROENTEROLOGY PROCEDURE H&P NOTE   Primary Care Physician: Garald Karlynn GAILS, MD  HPI: Troy Beck is a 68 y.o. male  who presents for history of polyps advanced and non-advanced adenomas.  Past Medical History:  Diagnosis Date   Allergy    Arthritis    Asthma    Cataract    cataract but not ready for surgery   Diabetes mellitus    Glaucoma    Heart murmur    may have something but doctor is monitoring it.   Hyperlipidemia    Hypertension    Retinal detachment    Past Surgical History:  Procedure Laterality Date   EYE SURGERY     FINGER SURGERY     FOOT SURGERY     Current Outpatient Medications  Medication Sig Dispense Refill   amLODipine  (NORVASC ) 10 MG tablet TAKE 1 TABLET BY MOUTH EVERY DAY 90 tablet 1   ASPIRIN PO Take by mouth.     brimonidine (ALPHAGAN) 0.2 % ophthalmic solution 3 (three) times daily.     chlorthalidone (HYGROTON) 25 MG tablet Take 25 mg by mouth daily. (Patient not taking: Reported on 03/07/2024)     Cholecalciferol (VITAMIN D3) 2000 units capsule Take 1 capsule (2,000 Units total) by mouth daily. 100 capsule 3   dorzolamide (TRUSOPT) 2 % ophthalmic solution 1 drop 3 (three) times daily.     doxazosin  (CARDURA ) 4 MG tablet TAKE 1 TABLET BY MOUTH EVERY DAY 90 tablet 1   empagliflozin  (JARDIANCE ) 25 MG TABS tablet TAKE 1 TABLET BY MOUTH DAILY BEFORE BREAKFAST. 90 tablet 1   Finerenone (KERENDIA) 10 MG TABS Take 10 mg by mouth daily at 6 (six) AM.     fluticasone  furoate-vilanterol (BREO ELLIPTA ) 100-25 MCG/ACT AEPB Inhale 1 puff into the lungs daily. 60 each 5   Lancets (ONETOUCH ULTRASOFT) lancets 1 each by Other route daily. Use as instructed 100 each 3   latanoprost (XALATAN) 0.005 % ophthalmic solution 1 drop at bedtime.     losartan  (COZAAR ) 100 MG tablet Take 1 tablet (100 mg total) by mouth daily. 90 tablet 3   metFORMIN  (GLUCOPHAGE -XR) 500 MG 24 hr tablet Take by mouth. (Patient not taking: Reported on 03/07/2024)     ONETOUCH  VERIO test strip 1 EACH BY OTHER ROUTE DAILY IN THE AFTERNOON. 100 strip 3   rosuvastatin  (CRESTOR ) 20 MG tablet Take 1 tablet (20 mg total) by mouth daily. 90 tablet 3   Semaglutide  (RYBELSUS ) 7 MG TABS Take 1 tablet (7 mg total) by mouth daily. 90 tablet 3   tadalafil  (CIALIS ) 5 MG tablet TAKE 1 TABLET (5 MG TOTAL) BY MOUTH DAILY. ANNUAL APPT DUE IN OCT MUST SEE PROVIDER FOR FUTURE REFILLS 30 tablet 1   No current facility-administered medications for this visit.    Current Outpatient Medications:    amLODipine  (NORVASC ) 10 MG tablet, TAKE 1 TABLET BY MOUTH EVERY DAY, Disp: 90 tablet, Rfl: 1   ASPIRIN PO, Take by mouth., Disp: , Rfl:    brimonidine (ALPHAGAN) 0.2 % ophthalmic solution, 3 (three) times daily., Disp: , Rfl:    chlorthalidone (HYGROTON) 25 MG tablet, Take 25 mg by mouth daily. (Patient not taking: Reported on 03/07/2024), Disp: , Rfl:    Cholecalciferol (VITAMIN D3) 2000 units capsule, Take 1 capsule (2,000 Units total) by mouth daily., Disp: 100 capsule, Rfl: 3   dorzolamide (TRUSOPT) 2 % ophthalmic solution, 1 drop 3 (three) times daily., Disp: , Rfl:    doxazosin  (CARDURA ) 4 MG tablet,  TAKE 1 TABLET BY MOUTH EVERY DAY, Disp: 90 tablet, Rfl: 1   empagliflozin  (JARDIANCE ) 25 MG TABS tablet, TAKE 1 TABLET BY MOUTH DAILY BEFORE BREAKFAST., Disp: 90 tablet, Rfl: 1   Finerenone (KERENDIA) 10 MG TABS, Take 10 mg by mouth daily at 6 (six) AM., Disp: , Rfl:    fluticasone  furoate-vilanterol (BREO ELLIPTA ) 100-25 MCG/ACT AEPB, Inhale 1 puff into the lungs daily., Disp: 60 each, Rfl: 5   Lancets (ONETOUCH ULTRASOFT) lancets, 1 each by Other route daily. Use as instructed, Disp: 100 each, Rfl: 3   latanoprost (XALATAN) 0.005 % ophthalmic solution, 1 drop at bedtime., Disp: , Rfl:    losartan  (COZAAR ) 100 MG tablet, Take 1 tablet (100 mg total) by mouth daily., Disp: 90 tablet, Rfl: 3   metFORMIN  (GLUCOPHAGE -XR) 500 MG 24 hr tablet, Take by mouth. (Patient not taking: Reported on  03/07/2024), Disp: , Rfl:    ONETOUCH VERIO test strip, 1 EACH BY OTHER ROUTE DAILY IN THE AFTERNOON., Disp: 100 strip, Rfl: 3   rosuvastatin  (CRESTOR ) 20 MG tablet, Take 1 tablet (20 mg total) by mouth daily., Disp: 90 tablet, Rfl: 3   Semaglutide  (RYBELSUS ) 7 MG TABS, Take 1 tablet (7 mg total) by mouth daily., Disp: 90 tablet, Rfl: 3   tadalafil  (CIALIS ) 5 MG tablet, TAKE 1 TABLET (5 MG TOTAL) BY MOUTH DAILY. ANNUAL APPT DUE IN OCT MUST SEE PROVIDER FOR FUTURE REFILLS, Disp: 30 tablet, Rfl: 1 Allergies  Allergen Reactions   Quinine Derivatives    Family History  Problem Relation Age of Onset   Heart disease Mother 10       ?aneurism, brain   Heart disease Father 40       ?MI   Hypertension Sister    Stroke Brother    Colon cancer Neg Hx    Colon polyps Neg Hx    Esophageal cancer Neg Hx    Rectal cancer Neg Hx    Stomach cancer Neg Hx    Social History   Socioeconomic History   Marital status: Married    Spouse name: Not on file   Number of children: Not on file   Years of education: Not on file   Highest education level: Not on file  Occupational History   Not on file  Tobacco Use   Smoking status: Never   Smokeless tobacco: Never  Vaping Use   Vaping status: Former  Substance and Sexual Activity   Alcohol use: Yes    Comment: rarely   Drug use: No   Sexual activity: Yes  Other Topics Concern   Not on file  Social History Narrative   Not on file   Social Drivers of Health   Financial Resource Strain: Not on file  Food Insecurity: Not on file  Transportation Needs: Not on file  Physical Activity: Not on file  Stress: Not on file  Social Connections: Not on file  Intimate Partner Violence: Not on file    Physical Exam: There were no vitals filed for this visit. There is no height or weight on file to calculate BMI. GEN: NAD EYE: Sclerae anicteric ENT: MMM CV: Non-tachycardic GI: Soft, NT/ND NEURO:  Alert & Oriented x 3  Lab Results: No results  for input(s): WBC, HGB, HCT, PLT in the last 72 hours. BMET No results for input(s): NA, K, CL, CO2, GLUCOSE, BUN, CREATININE, CALCIUM  in the last 72 hours. LFT No results for input(s): PROT, ALBUMIN, AST, ALT, ALKPHOS, BILITOT, BILIDIR, IBILI in the  last 72 hours. PT/INR No results for input(s): LABPROT, INR in the last 72 hours.   Impression / Plan: This is a 68 y.o.male who presents for history of polyps advanced and non-advanced adenomas.  The risks and benefits of endoscopic evaluation/treatment were discussed with the patient and/or family; these include but are not limited to the risk of perforation, infection, bleeding, missed lesions, lack of diagnosis, severe illness requiring hospitalization, as well as anesthesia and sedation related illnesses.  The patient's history has been reviewed, patient examined, no change in status, and deemed stable for procedure.  The patient and/or family is agreeable to proceed.    Aloha Finner, MD [facility] Office # [phone]

## 2024-03-21 NOTE — Progress Notes (Unsigned)
 Vss nad trans to pacu

## 2024-03-21 NOTE — Progress Notes (Unsigned)
 Called to room to assist during endoscopic procedure.  Patient ID and intended procedure confirmed with present staff. Received instructions for my participation in the procedure from the performing physician.

## 2024-03-21 NOTE — Patient Instructions (Signed)
 Use fiber con 1-2 tablets per day. Resume all of your previous medications today as ordered. Read your discharge instructions.   YOU HAD AN ENDOSCOPIC PROCEDURE TODAY AT THE [facility]:   Refer to the procedure report that was given to you for any specific questions about what was found during the examination.  If the procedure report does not answer your questions, please call your gastroenterologist to clarify.  If you requested that your care partner not be given the details of your procedure findings, then the procedure report has been included in a sealed envelope for you to review at your convenience later.  YOU SHOULD EXPECT: Some feelings of bloating in the abdomen. Passage of more gas than usual.  Walking can help get rid of the air that was put into your GI tract during the procedure and reduce the bloating. If you had a lower endoscopy (such as a colonoscopy or flexible sigmoidoscopy) you may notice spotting of blood in your stool or on the toilet paper. If you underwent a bowel prep for your procedure, you may not have a normal bowel movement for a few days.  Please Note:  You might notice some irritation and congestion in your nose or some drainage.  This is from the oxygen used during your procedure.  There is no need for concern and it should clear up in a day or so.  SYMPTOMS TO REPORT IMMEDIATELY:  Following lower endoscopy (colonoscopy or flexible sigmoidoscopy):  Excessive amounts of blood in the stool  Significant tenderness or worsening of abdominal pains  Swelling of the abdomen that is new, acute  Fever of 100F or higher  For urgent or emergent issues, a gastroenterologist can be reached at any hour by calling [phone]. Do not use MyChart messaging for urgent concerns.    DIET:  We do recommend a small meal at first. Try to increase the fiber in your diet, andyou may proceed to your regular diet.  Drink plenty of fluids but you should avoid alcoholic  beverages for 24 hours.  ACTIVITY:  You should plan to take it easy for the rest of today and you should NOT DRIVE or use heavy machinery until tomorrow (because of the sedation medicines used during the test).    FOLLOW UP: Our staff will call the number listed on your records the next business day following your procedure.  We will call around 7:15- 8:00 am to check on you and address any questions or concerns that you may have regarding the information given to you following your procedure. If we do not reach you, we will leave a message.     If any biopsies were taken you will be contacted by phone or by letter within the next 1-3 weeks.  Please call us  at [phone] if you have not heard about the biopsies in 3 weeks.    SIGNATURES/CONFIDENTIALITY: You and/or your care partner have signed paperwork which will be entered into your electronic medical record.  These signatures attest to the fact that that the information above on your After Visit Summary has been reviewed and is understood.  Full responsibility of the confidentiality of this discharge information lies with you and/or your care-partner.

## 2024-03-22 ENCOUNTER — Telehealth: Payer: Self-pay | Admitting: *Deleted

## 2024-03-22 NOTE — Telephone Encounter (Signed)
  Follow up Call-     03/21/2024    2:58 PM  Call back number  Post procedure Call Back phone  # [phone]  Permission to leave phone message Yes    Left message on machine to call back if any questions or concerns

## 2024-03-24 LAB — SURGICAL PATHOLOGY

## 2024-03-25 ENCOUNTER — Ambulatory Visit: Payer: Self-pay | Admitting: Gastroenterology

## 2024-04-28 ENCOUNTER — Ambulatory Visit: Admitting: Internal Medicine

## 2024-05-05 ENCOUNTER — Encounter: Payer: Self-pay | Admitting: Internal Medicine

## 2024-05-05 ENCOUNTER — Ambulatory Visit (INDEPENDENT_AMBULATORY_CARE_PROVIDER_SITE_OTHER): Admitting: Internal Medicine

## 2024-05-05 VITALS — BP 138/96 | HR 84 | Ht 68.0 in | Wt 180.0 lb

## 2024-05-05 DIAGNOSIS — Z7984 Long term (current) use of oral hypoglycemic drugs: Secondary | ICD-10-CM

## 2024-05-05 DIAGNOSIS — E1122 Type 2 diabetes mellitus with diabetic chronic kidney disease: Secondary | ICD-10-CM | POA: Diagnosis not present

## 2024-05-05 DIAGNOSIS — N1831 Chronic kidney disease, stage 3a: Secondary | ICD-10-CM

## 2024-05-05 LAB — POCT GLUCOSE (DEVICE FOR HOME USE): POC Glucose: 108 mg/dL — AB (ref 70–99)

## 2024-05-05 LAB — POCT GLYCOSYLATED HEMOGLOBIN (HGB A1C): Hemoglobin A1C: 6 % — AB (ref 4.0–5.6)

## 2024-05-05 MED ORDER — EMPAGLIFLOZIN 25 MG PO TABS: 25.0000 mg | ORAL_TABLET | Freq: Every day | ORAL | 3 refills | Status: AC

## 2024-05-05 MED ORDER — RYBELSUS 7 MG PO TABS: 7.0000 mg | ORAL_TABLET | Freq: Every day | ORAL | 3 refills | Status: AC

## 2024-05-05 NOTE — Patient Instructions (Signed)
-   Continue Rybelsus 7 mg daily  - Continue  Jardiance to 25 mg daily      HOW TO TREAT LOW BLOOD SUGARS (Blood sugar LESS THAN 70 MG/DL) Please follow the RULE OF 15 for the treatment of hypoglycemia treatment (when your (blood sugars are less than 70 mg/dL)   STEP 1: Take 15 grams of carbohydrates when your blood sugar is low, which includes:  3-4 GLUCOSE TABS  OR 3-4 OZ OF JUICE OR REGULAR SODA OR ONE TUBE OF GLUCOSE GEL    STEP 2: RECHECK blood sugar in 15 MINUTES STEP 3: If your blood sugar is still low at the 15 minute recheck --> then, go back to STEP 1 and treat AGAIN with another 15 grams of carbohydrates.

## 2024-05-05 NOTE — Progress Notes (Signed)
 Name: Troy Beck  Age/ Sex: 68 y.o., male   MRN/ DOB: [id number], 02/26/1956     PCP: Garald Karlynn GAILS, MD   Reason for Endocrinology Evaluation: Type 2 Diabetes Mellitus  Initial Endocrine Consultative Visit: 12/11/2019    PATIENT IDENTIFIER: [name] is a 68 y.o. male with a past medical history of HTN, T2DM and Dyslipidemia. The patient has followed with Endocrinology clinic since 12/11/2019 for consultative assistance with management of his diabetes.  DIABETIC HISTORY:  [name] was diagnosed with T2DM in 1995. He has been intermittently on metformin  since his diagnosis. He was on Repaglinide  at some point, no reported intolerance.   His hemoglobin A1c has ranged from 6.7% in 2012, peaking at 8.4% in 2021.    On his initial visit to our clinic he had an A1c of 8.4% . We did not make any changes as he had just made lifestyle changes    Stopped  metformin  due to GFR of 30, and started Rybelsus  09/2022  He is a residential Associate Professor SUBJECTIVE:   During the last visit (10/26/2023): A1c 6.6%       Today (05/05/2024): [name] is here for a follow up on diabetes management.  He checks his glucose occasionally .  Continues to travel a lot for business, and has not been able to check glucose on regular basis Patient has been noted weight loss since his last visit here No nausea or vomiting  No constipation or diarrhea  No urine infection    Follows with nephrology - Dr. Marlee     HOME DIABETES REGIMEN:  Rybelsus  7 mg daily  Jardiance  25 mg daily        Statin: yes ACE-I/ARB: yes    METER DOWNLOAD SUMMARY: n/a  DIABETIC COMPLICATIONS: Microvascular complications:  CKD III Denies: retinopathy, neuropathy Last Eye Exam: Completed  2023  Macrovascular complications:   Denies: CAD, CVA, PVD   HISTORY:  Past Medical History:  Past Medical History:  Diagnosis Date   Allergy    Arthritis    Asthma    Cataract     cataract but not ready for surgery   Diabetes mellitus    Glaucoma    Heart murmur    may have something but doctor is monitoring it.   Hyperlipidemia    Hypertension    Retinal detachment    Past Surgical History:  Past Surgical History:  Procedure Laterality Date   COLONOSCOPY     EYE SURGERY     FINGER SURGERY     FOOT SURGERY     Social History:  reports that he has never smoked. He has never used smokeless tobacco. He reports current alcohol use. He reports that he does not use drugs. Family History:  Family History  Problem Relation Age of Onset   Heart disease Mother 53       ?aneurism, brain   Heart disease Father 8       ?MI   Hypertension Sister    Stroke Brother    Colon cancer Neg Hx    Colon polyps Neg Hx    Esophageal cancer Neg Hx    Rectal cancer Neg Hx    Stomach cancer Neg Hx      HOME MEDICATIONS: Allergies as of 05/05/2024       Reactions   Quinine Derivatives Itching        Medication List        Accurate as of May 05, 2024  1:09  PM. If you have any questions, ask your nurse or doctor.          amLODipine  10 MG tablet Commonly known as: NORVASC  TAKE 1 TABLET BY MOUTH EVERY DAY   ASPIRIN PO Take by mouth.   brimonidine 0.2 % ophthalmic solution Commonly known as: ALPHAGAN 3 (three) times daily.   dorzolamide 2 % ophthalmic solution Commonly known as: TRUSOPT 1 drop 3 (three) times daily.   doxazosin  4 MG tablet Commonly known as: CARDURA  TAKE 1 TABLET BY MOUTH EVERY DAY   fluticasone  furoate-vilanterol 100-25 MCG/ACT Aepb Commonly known as: Breo Ellipta  Inhale 1 puff into the lungs daily.   Jardiance  25 MG Tabs tablet Generic drug: empagliflozin  TAKE 1 TABLET BY MOUTH DAILY BEFORE BREAKFAST.   Kerendia 10 MG Tabs Generic drug: Finerenone Take 10 mg by mouth daily at 6 (six) AM.   latanoprost 0.005 % ophthalmic solution Commonly known as: XALATAN 1 drop at bedtime.   losartan  100 MG tablet Commonly  known as: COZAAR  Take 1 tablet (100 mg total) by mouth daily.   onetouch ultrasoft lancets 1 each by Other route daily. Use as instructed   OneTouch Verio test strip Generic drug: glucose blood 1 EACH BY OTHER ROUTE DAILY IN THE AFTERNOON.   rosuvastatin  20 MG tablet Commonly known as: Crestor  Take 1 tablet (20 mg total) by mouth daily.   Rybelsus  7 MG Tabs Generic drug: Semaglutide  Take 1 tablet (7 mg total) by mouth daily.   tadalafil  5 MG tablet Commonly known as: CIALIS  TAKE 1 TABLET (5 MG TOTAL) BY MOUTH DAILY. ANNUAL APPT DUE IN OCT MUST SEE PROVIDER FOR FUTURE REFILLS   Vitamin D3 50 MCG (2000 UT) capsule Take 1 capsule (2,000 Units total) by mouth daily.         OBJECTIVE:   Vital Signs: BP (!) 138/96 (BP Location: Left Arm, Patient Position: Sitting, Cuff Size: Normal)   Pulse 84   Ht 5' 8 (1.727 m)   Wt 180 lb (81.6 kg)   SpO2 98%   BMI 27.37 kg/m   Wt Readings from Last 3 Encounters:  05/05/24 180 lb (81.6 kg)  03/21/24 184 lb (83.5 kg)  03/07/24 182 lb (82.6 kg)     Exam: General: Pt appears well and is in NAD  Lungs: Clear with good BS bilat    Heart: RRR   Abdomen: soft, nontender  Extremities: No pretibial edema.   Neuro: MS is good with appropriate affect, pt is alert and Ox3    DM foot exam: 05/05/2024  The skin of the feet is intact without sores or ulcerations. The pedal pulses are 2+ on right and 2+ on left. The sensation is intact to a screening 5.07, 10 gram monofilament bilaterally  DATA REVIEWED:  Lab Results  Component Value Date   HGBA1C 6.6 (A) 10/26/2023   HGBA1C 6.7 (A) 04/27/2023   HGBA1C 6.7 (A) 10/08/2022    Latest Reference Range & Units 12/14/23 11:47  Sodium 135 - 145 mEq/L 134 (L)  Potassium 3.5 - 5.1 mEq/L 4.5  Chloride 96 - 112 mEq/L 103  CO2 19 - 32 mEq/L 23  Glucose 70 - 99 mg/dL 876 (H)  BUN 6 - 23 mg/dL 35 (H)  Creatinine 9.59 - 1.50 mg/dL 7.86 (H)  Calcium  8.4 - 10.5 mg/dL 9.5  Alkaline  Phosphatase 39 - 117 U/L 52  Albumin 3.5 - 5.2 g/dL 4.1  AST 0 - 37 U/L 25  ALT 0 - 53 U/L 20  Total  Protein 6.0 - 8.3 g/dL 7.5  Total Bilirubin 0.2 - 1.2 mg/dL 0.8  GFR >39.99 mL/min 31.04 (L)  Total CHOL/HDL Ratio  3  Cholesterol 0 - 200 mg/dL 827  HDL Cholesterol >60.99 mg/dL 48.99  LDL (calc) 0 - 99 mg/dL 892 (H)  NonHDL  878.57  Triglycerides 0.0 - 149.0 mg/dL 26.9  VLDL 0.0 - 59.9 mg/dL 85.3   In office BG 891 mg/dL   ASSESSMENT / PLAN / RECOMMENDATIONS:   1) Type 2 Diabetes Mellitus, Optimally controlled, With CKD III complications - Most recent A1c of 6.0 %. Goal A1c < 7.0 %.     -A1c has remained optimal -Metformin  was discontinued with a GFR <35 - No changes at this time   MEDICATIONS:  -Continue Rybelsus  7 mg  daily -Continue Jardiance  25 mg daily    EDUCATION / INSTRUCTIONS: BG monitoring instructions: Patient is instructed to check his blood sugars 3 times a week Call [facility] if: BG persistently < 70 I reviewed the Rule of 15 for the treatment of hypoglycemia in detail with the patient. Literature supplied.    2) Diabetic complications:  Eye: Does not have known diabetic retinopathy.  Neuro/ Feet: Does not have known diabetic peripheral neuropathy .  Renal: Patient does have known baseline CKD.  He   is  on an ACEI/ARB at present.  3)Dyslipidemia:  -Lipid panel shows elevated LDL in 05/2023, LDL on today's labs continues to show above goal levels - I switched atorvastatin  to rosuvastatin  in April, 2025  Medication  Continue rosuvastatin  20 mg daily  F/U in 6 months      Signed electronically by: Stefano Redgie Butts, MD  [facility]  [facility] [address] [place], [place] [address] Phone: [phone] FAX: [phone]   CC: Garald Karlynn GAILS, MD [address] [place] [address] Phone: [phone]  Fax: [phone]  Return to Endocrinology clinic as  below: Future Appointments  Date Time Provider Department Center  05/05/2024  1:20 PM Jahliyah Trice, Donell Redgie, MD [id number] None

## 2024-07-22 ENCOUNTER — Other Ambulatory Visit: Payer: Self-pay | Admitting: Internal Medicine

## 2024-11-03 ENCOUNTER — Ambulatory Visit: Admitting: Internal Medicine
